# Patient Record
Sex: Female | Born: 1975 | Race: Black or African American | Hispanic: No | Marital: Married | Smoking: Former smoker
Health system: Southern US, Community
[De-identification: ages and names within clinical notes are randomized; demographics above are authoritative.]

## PROBLEM LIST (undated history)

## (undated) ENCOUNTER — Inpatient Hospital Stay (HOSPITAL_COMMUNITY): Payer: Self-pay

## (undated) DIAGNOSIS — C801 Malignant (primary) neoplasm, unspecified: Secondary | ICD-10-CM

## (undated) DIAGNOSIS — Z8679 Personal history of other diseases of the circulatory system: Secondary | ICD-10-CM

## (undated) DIAGNOSIS — Z8742 Personal history of other diseases of the female genital tract: Secondary | ICD-10-CM

## (undated) DIAGNOSIS — Z8632 Personal history of gestational diabetes: Secondary | ICD-10-CM

## (undated) DIAGNOSIS — B009 Herpesviral infection, unspecified: Secondary | ICD-10-CM

## (undated) DIAGNOSIS — Z8719 Personal history of other diseases of the digestive system: Secondary | ICD-10-CM

## (undated) DIAGNOSIS — K219 Gastro-esophageal reflux disease without esophagitis: Secondary | ICD-10-CM

## (undated) DIAGNOSIS — D332 Benign neoplasm of brain, unspecified: Secondary | ICD-10-CM

## (undated) DIAGNOSIS — N920 Excessive and frequent menstruation with regular cycle: Secondary | ICD-10-CM

## (undated) HISTORY — PX: DILATION AND EVACUATION: SHX1459

---

## 1997-12-22 ENCOUNTER — Ambulatory Visit: Admission: RE | Admit: 1997-12-22 | Discharge: 1997-12-22 | Payer: Self-pay | Admitting: *Deleted

## 1998-02-15 ENCOUNTER — Ambulatory Visit (HOSPITAL_COMMUNITY): Admission: RE | Admit: 1998-02-15 | Discharge: 1998-02-15 | Payer: Self-pay | Admitting: *Deleted

## 1998-03-18 ENCOUNTER — Inpatient Hospital Stay (HOSPITAL_COMMUNITY): Admission: AD | Admit: 1998-03-18 | Discharge: 1998-03-18 | Payer: Self-pay | Admitting: *Deleted

## 1998-03-19 ENCOUNTER — Inpatient Hospital Stay (HOSPITAL_COMMUNITY): Admission: AD | Admit: 1998-03-19 | Discharge: 1998-03-23 | Payer: Self-pay | Admitting: *Deleted

## 1998-05-27 ENCOUNTER — Inpatient Hospital Stay (HOSPITAL_COMMUNITY): Admission: AD | Admit: 1998-05-27 | Discharge: 1998-05-27 | Payer: Self-pay | Admitting: *Deleted

## 1998-05-28 ENCOUNTER — Inpatient Hospital Stay (HOSPITAL_COMMUNITY): Admission: AD | Admit: 1998-05-28 | Discharge: 1998-05-30 | Payer: Self-pay | Admitting: *Deleted

## 1998-06-01 ENCOUNTER — Encounter (HOSPITAL_COMMUNITY): Admission: RE | Admit: 1998-06-01 | Discharge: 1998-06-14 | Payer: Self-pay | Admitting: *Deleted

## 1998-07-07 ENCOUNTER — Inpatient Hospital Stay (HOSPITAL_COMMUNITY): Admission: AD | Admit: 1998-07-07 | Discharge: 1998-07-07 | Payer: Self-pay | Admitting: *Deleted

## 1998-09-17 ENCOUNTER — Emergency Department (HOSPITAL_COMMUNITY): Admission: EM | Admit: 1998-09-17 | Discharge: 1998-09-17 | Payer: Self-pay | Admitting: Emergency Medicine

## 1999-02-05 ENCOUNTER — Encounter: Payer: Self-pay | Admitting: Emergency Medicine

## 1999-02-05 ENCOUNTER — Emergency Department (HOSPITAL_COMMUNITY): Admission: EM | Admit: 1999-02-05 | Discharge: 1999-02-05 | Payer: Self-pay | Admitting: Emergency Medicine

## 1999-04-23 ENCOUNTER — Emergency Department (HOSPITAL_COMMUNITY): Admission: EM | Admit: 1999-04-23 | Discharge: 1999-04-23 | Payer: Self-pay | Admitting: Emergency Medicine

## 2005-09-18 DIAGNOSIS — B009 Herpesviral infection, unspecified: Secondary | ICD-10-CM

## 2005-09-18 HISTORY — DX: Herpesviral infection, unspecified: B00.9

## 2009-03-29 ENCOUNTER — Ambulatory Visit (HOSPITAL_COMMUNITY): Admission: RE | Admit: 2009-03-29 | Discharge: 2009-03-29 | Payer: Self-pay | Admitting: Family Medicine

## 2009-04-28 ENCOUNTER — Encounter: Admission: RE | Admit: 2009-04-28 | Discharge: 2009-04-28 | Payer: Self-pay | Admitting: Family Medicine

## 2009-12-17 ENCOUNTER — Emergency Department (HOSPITAL_COMMUNITY): Admission: EM | Admit: 2009-12-17 | Discharge: 2009-12-17 | Payer: Self-pay | Admitting: Family Medicine

## 2010-05-18 ENCOUNTER — Other Ambulatory Visit: Admission: RE | Admit: 2010-05-18 | Discharge: 2010-05-18 | Payer: Self-pay | Admitting: Family Medicine

## 2010-05-27 ENCOUNTER — Encounter: Admission: RE | Admit: 2010-05-27 | Discharge: 2010-05-27 | Payer: Self-pay | Admitting: Family Medicine

## 2010-08-28 ENCOUNTER — Emergency Department (HOSPITAL_COMMUNITY)
Admission: EM | Admit: 2010-08-28 | Discharge: 2010-08-28 | Payer: Self-pay | Source: Home / Self Care | Admitting: Emergency Medicine

## 2010-09-24 ENCOUNTER — Inpatient Hospital Stay (HOSPITAL_COMMUNITY)
Admission: AD | Admit: 2010-09-24 | Discharge: 2010-09-24 | Payer: Self-pay | Source: Home / Self Care | Attending: Obstetrics and Gynecology | Admitting: Obstetrics and Gynecology

## 2010-10-03 LAB — URINE CULTURE
Colony Count: >=100000
Culture  Setup Time: 201201080027

## 2010-10-03 LAB — URINALYSIS, ROUTINE W REFLEX MICROSCOPIC
Bilirubin Urine: NEGATIVE
Ketones, ur: NEGATIVE mg/dL
Nitrite: NEGATIVE
Protein, ur: NEGATIVE mg/dL
Specific Gravity, Urine: 1.015 (ref 1.005–1.030)
Urine Glucose, Fasting: NEGATIVE mg/dL
Urobilinogen, UA: 0.2 mg/dL (ref 0.0–1.0)
pH: 6 (ref 5.0–8.0)

## 2010-10-03 LAB — CBC
HCT: 35 % — ABNORMAL LOW (ref 36.0–46.0)
Hemoglobin: 11.3 g/dL — ABNORMAL LOW (ref 12.0–15.0)
MCH: 25.2 pg — ABNORMAL LOW (ref 26.0–34.0)
MCHC: 32.3 g/dL (ref 30.0–36.0)
MCV: 78.1 fL (ref 78.0–100.0)
Platelets: 246 10*3/uL (ref 150–400)
RBC: 4.48 MIL/uL (ref 3.87–5.11)
RDW: 13.6 % (ref 11.5–15.5)
WBC: 9.3 10*3/uL (ref 4.0–10.5)

## 2010-10-03 LAB — GC/CHLAMYDIA PROBE AMP, GENITAL
Chlamydia, DNA Probe: NEGATIVE
GC Probe Amp, Genital: NEGATIVE

## 2010-10-03 LAB — URINE MICROSCOPIC-ADD ON

## 2010-10-03 LAB — WET PREP, GENITAL
Clue Cells Wet Prep HPF POC: NONE SEEN
Yeast Wet Prep HPF POC: NONE SEEN

## 2010-10-03 LAB — POCT PREGNANCY, URINE: Preg Test, Ur: POSITIVE

## 2010-10-03 LAB — HCG, QUANTITATIVE, PREGNANCY: hCG, Beta Chain, Quant, S: 4346 m[IU]/mL — ABNORMAL HIGH (ref <5)

## 2010-10-08 ENCOUNTER — Encounter: Payer: Self-pay | Admitting: Internal Medicine

## 2010-10-10 ENCOUNTER — Encounter: Payer: Self-pay | Admitting: Family Medicine

## 2010-11-29 LAB — COMPREHENSIVE METABOLIC PANEL
ALT: 13 U/L (ref 0–35)
AST: 18 U/L (ref 0–37)
Albumin: 3.6 g/dL (ref 3.5–5.2)
Alkaline Phosphatase: 105 U/L (ref 39–117)
BUN: 7 mg/dL (ref 6–23)
CO2: 25 mEq/L (ref 19–32)
Calcium: 8.8 mg/dL (ref 8.4–10.5)
Chloride: 107 mEq/L (ref 96–112)
Creatinine, Ser: 0.68 mg/dL (ref 0.4–1.2)
GFR calc Af Amer: >60 mL/min (ref >60)
GFR calc non Af Amer: >60 mL/min (ref >60)
Glucose, Bld: 116 mg/dL — ABNORMAL HIGH (ref 70–99)
Potassium: 3.2 mEq/L — ABNORMAL LOW (ref 3.5–5.1)
Sodium: 138 mEq/L (ref 135–145)
Total Bilirubin: 0.3 mg/dL (ref 0.3–1.2)
Total Protein: 7.2 g/dL (ref 6.0–8.3)

## 2010-11-29 LAB — LIPASE, BLOOD: Lipase: 48 U/L (ref 11–59)

## 2010-11-29 LAB — POCT PREGNANCY, URINE: Preg Test, Ur: NEGATIVE

## 2010-11-29 LAB — POCT CARDIAC MARKERS
CKMB, poc: <1 ng/mL — ABNORMAL LOW (ref 1.0–8.0)
Myoglobin, poc: 76.7 ng/mL (ref 12–200)
Troponin i, poc: <0.05 ng/mL (ref 0.00–0.09)

## 2010-12-07 LAB — POCT URINALYSIS DIP (DEVICE)
Bilirubin Urine: NEGATIVE
Glucose, UA: NEGATIVE mg/dL
Ketones, ur: NEGATIVE mg/dL
Nitrite: NEGATIVE
Protein, ur: NEGATIVE mg/dL
Specific Gravity, Urine: 1.02 (ref 1.005–1.030)
Urobilinogen, UA: 2 mg/dL — ABNORMAL HIGH (ref 0.0–1.0)
pH: 6.5 (ref 5.0–8.0)

## 2010-12-07 LAB — GC/CHLAMYDIA PROBE AMP, GENITAL
Chlamydia, DNA Probe: NEGATIVE
GC Probe Amp, Genital: POSITIVE — AB

## 2010-12-07 LAB — POCT PREGNANCY, URINE: Preg Test, Ur: NEGATIVE

## 2011-04-25 ENCOUNTER — Emergency Department (HOSPITAL_COMMUNITY): Payer: No Typology Code available for payment source

## 2011-04-25 ENCOUNTER — Emergency Department (HOSPITAL_COMMUNITY)
Admission: EM | Admit: 2011-04-25 | Discharge: 2011-04-25 | Disposition: A | Discharge status: Home / Self Care | Payer: No Typology Code available for payment source | Attending: Emergency Medicine | Admitting: Emergency Medicine

## 2011-04-25 DIAGNOSIS — M542 Cervicalgia: Secondary | ICD-10-CM | POA: Insufficient documentation

## 2011-04-25 DIAGNOSIS — S139XXA Sprain of joints and ligaments of unspecified parts of neck, initial encounter: Secondary | ICD-10-CM | POA: Insufficient documentation

## 2011-04-25 LAB — POCT PREGNANCY, URINE: Preg Test, Ur: NEGATIVE

## 2011-08-14 ENCOUNTER — Encounter (HOSPITAL_COMMUNITY): Payer: Self-pay | Admitting: *Deleted

## 2011-08-14 ENCOUNTER — Inpatient Hospital Stay (HOSPITAL_COMMUNITY): Payer: Medicaid Other

## 2011-08-14 ENCOUNTER — Inpatient Hospital Stay (HOSPITAL_COMMUNITY)
Admission: AD | Admit: 2011-08-14 | Discharge: 2011-08-14 | Disposition: A | Discharge status: Home / Self Care | Payer: Medicaid Other | Source: Ambulatory Visit | Attending: Obstetrics & Gynecology | Admitting: Obstetrics & Gynecology

## 2011-08-14 DIAGNOSIS — Z1389 Encounter for screening for other disorder: Secondary | ICD-10-CM

## 2011-08-14 DIAGNOSIS — Z3490 Encounter for supervision of normal pregnancy, unspecified, unspecified trimester: Secondary | ICD-10-CM

## 2011-08-14 DIAGNOSIS — R109 Unspecified abdominal pain: Secondary | ICD-10-CM | POA: Insufficient documentation

## 2011-08-14 DIAGNOSIS — O2390 Unspecified genitourinary tract infection in pregnancy, unspecified trimester: Secondary | ICD-10-CM | POA: Insufficient documentation

## 2011-08-14 DIAGNOSIS — K5900 Constipation, unspecified: Secondary | ICD-10-CM

## 2011-08-14 DIAGNOSIS — N390 Urinary tract infection, site not specified: Secondary | ICD-10-CM

## 2011-08-14 HISTORY — DX: Herpesviral infection, unspecified: B00.9

## 2011-08-14 LAB — DIFFERENTIAL
Basophils Absolute: 0 10*3/uL (ref 0.0–0.1)
Basophils Relative: 0 % (ref 0–1)
Monocytes Relative: 9 % (ref 3–12)
Neutro Abs: 5.6 10*3/uL (ref 1.7–7.7)
Neutrophils Relative %: 60 % (ref 43–77)

## 2011-08-14 LAB — URINE MICROSCOPIC-ADD ON

## 2011-08-14 LAB — WET PREP, GENITAL
Clue Cells Wet Prep HPF POC: NONE SEEN
Trich, Wet Prep: NONE SEEN

## 2011-08-14 LAB — CBC
Hemoglobin: 11.2 g/dL — ABNORMAL LOW (ref 12.0–15.0)
MCHC: 32.2 g/dL (ref 30.0–36.0)
Platelets: 225 10*3/uL (ref 150–400)

## 2011-08-14 LAB — ABO/RH: ABO/RH(D): O POS

## 2011-08-14 LAB — URINALYSIS, ROUTINE W REFLEX MICROSCOPIC
Bilirubin Urine: NEGATIVE
Nitrite: NEGATIVE
Specific Gravity, Urine: 1.015 (ref 1.005–1.030)
Urobilinogen, UA: 0.2 mg/dL (ref 0.0–1.0)

## 2011-08-14 MED ORDER — NITROFURANTOIN MONOHYD MACRO 100 MG PO CAPS: 100.0000 mg | ORAL_CAPSULE | Freq: Two times a day (BID) | ORAL | Status: AC

## 2011-08-14 MED ORDER — ACETAMINOPHEN 325 MG PO TABS
650.0000 mg | ORAL_TABLET | Freq: Once | ORAL | Status: AC
  Administered 2011-08-14: 650 mg via ORAL
  Filled 2011-08-14: qty 2

## 2011-08-14 NOTE — Progress Notes (Signed)
started feeling bad yesterday, cramping.  No bleeding.  Taking tramadol for a toothache- did not help cramping.

## 2011-08-14 NOTE — ED Provider Notes (Signed)
History     CSN: [id number] Arrival date & time: 08/14/2011  9:20 AM   None     No chief complaint on file.   () HPI Kristina Castro is a 35 y.o. female @ [redacted]w[redacted]d gestation who presents to MAU for lower abdominal cramping. Onset of pain yesterday morning. Pain at worst is 8/10 and is currently 8/10. Has not started any prenatal care and was concerned about the amount of pain. Current sex partner x 1 year. LMP 05/25/11. Last pap smear less than one year and was normal at [facility]. Hx of HSV and trichomonas.    Past Medical History  Diagnosis Date  . Abnormal Pap smear   . Heart murmur   . Gestational diabetes   . HSV-2 (herpes simplex virus 2) infection 2007    currently taking meds.  . Anemia   . Gastritis without bleeding 1998    currently on prilosec 40 mg    Past Surgical History  Procedure Date  . Tooth extraction   . Wisdom tooth extraction     Family History  Problem Relation Age of Onset  . Anesthesia problems Neg Hx     History  Substance Use Topics  . Smoking status: Former Smoker -- 0.2 packs/day for .5 years    Types: Cigars    Quit date: 06/14/2011  . Smokeless tobacco: Never Used  . Alcohol Use: No    OB History    Grav Para Term Preterm Abortions TAB SAB Ect Mult Living   4 3 3  0 0 0 0 0 0 3      Review of Systems  Constitutional: Positive for fatigue. Negative for fever, chills and diaphoresis.  HENT: Negative for ear pain, congestion, sore throat, facial swelling, neck pain, neck stiffness, dental problem and sinus pressure.   Eyes: Negative for photophobia, pain and discharge.  Respiratory: Positive for cough. Negative for chest tightness and wheezing.   Cardiovascular:       Heart murmer   Gastrointestinal: Positive for nausea and constipation. Negative for vomiting, abdominal pain, diarrhea and abdominal distention.  Genitourinary: Positive for frequency, vaginal discharge and pelvic pain. Negative for dysuria, urgency, flank pain,  vaginal bleeding and difficulty urinating.  Musculoskeletal: Positive for back pain. Negative for myalgias and gait problem.  Skin: Negative for color change and rash.  Neurological: Positive for light-headedness. Negative for dizziness, speech difficulty, weakness, numbness and headaches.  Psychiatric/Behavioral: Negative for confusion and agitation.    Allergies  Review of patient's allergies indicates no known allergies.  Home Medications  No current outpatient prescriptions on file.  BP 138/58  Pulse 96  Temp(Src) 98.9 F (37.2 C) (Oral)  Resp 20  Ht 5\' 5"  (1.651 m)  Wt 191 lb (86.637 kg)  BMI 31.78 kg/m2  LMP 05/25/2011  Physical Exam  Nursing note and vitals reviewed. Constitutional: She is oriented to person, place, and time. She appears well-developed and well-nourished.  HENT:  Head: Normocephalic and atraumatic.  Eyes: EOM are normal.  Neck: Neck supple.  Cardiovascular: Normal rate.   Pulmonary/Chest: Effort normal.  Abdominal: Soft. There is tenderness in the left lower quadrant. There is no rebound and no guarding.  Genitourinary:       External genitalia without lesions. White vaginal discharge. Cervix closed, long. Uterus consistent with dates. Mildly tender left adnexa, no mass palpable. Rectal exam, small amount of hard stool palpated.  Musculoskeletal: Normal range of motion.  Neurological: She is alert and oriented to person, place,  and time. No cranial nerve deficit.  Skin: Skin is warm and dry.  Psychiatric: She has a normal mood and affect. Her behavior is normal. Judgment and thought content normal.   Results for orders placed during the hospital encounter of 08/14/11 (from the past 24 hour(s))  URINALYSIS, ROUTINE W REFLEX MICROSCOPIC     Status: Abnormal   Collection Time   08/14/11  9:35 AM      Component Value Range   Color, Urine YELLOW  YELLOW    Appearance CLEAR  CLEAR    Specific Gravity, Urine 1.015  1.005 - 1.030    pH 7.5  5.0 - 8.0      Glucose, UA NEGATIVE  NEGATIVE (mg/dL)   Hgb urine dipstick SMALL (*) NEGATIVE    Bilirubin Urine NEGATIVE  NEGATIVE    Ketones, ur NEGATIVE  NEGATIVE (mg/dL)   Protein, ur NEGATIVE  NEGATIVE (mg/dL)   Urobilinogen, UA 0.2  0.0 - 1.0 (mg/dL)   Nitrite NEGATIVE  NEGATIVE    Leukocytes, UA MODERATE (*) NEGATIVE   URINE MICROSCOPIC-ADD ON     Status: Abnormal   Collection Time   08/14/11  9:35 AM      Component Value Range   Squamous Epithelial / LPF MANY (*) RARE    WBC, UA 3-6  <3 (WBC/hpf)   RBC / HPF 3-6  <3 (RBC/hpf)   Bacteria, UA MANY (*) RARE   CBC     Status: Abnormal   Collection Time   08/14/11 10:35 AM      Component Value Range   WBC 9.2  4.0 - 10.5 (K/uL)   RBC 4.40  3.87 - 5.11 (MIL/uL)   Hemoglobin 11.2 (*) 12.0 - 15.0 (g/dL)   HCT 16.1 (*) 09.6 - 46.0 (%)   MCV 79.1  78.0 - 100.0 (fL)   MCH 25.5 (*) 26.0 - 34.0 (pg)   MCHC 32.2  30.0 - 36.0 (g/dL)   RDW 04.5  40.9 - 81.1 (%)   Platelets 225  150 - 400 (K/uL)  DIFFERENTIAL     Status: Normal   Collection Time   08/14/11 10:35 AM      Component Value Range   Neutrophils Relative 60  43 - 77 (%)   Neutro Abs 5.6  1.7 - 7.7 (K/uL)   Lymphocytes Relative 29  12 - 46 (%)   Lymphs Abs 2.7  0.7 - 4.0 (K/uL)   Monocytes Relative 9  3 - 12 (%)   Monocytes Absolute 0.8  0.1 - 1.0 (K/uL)   Eosinophils Relative 2  0 - 5 (%)   Eosinophils Absolute 0.2  0.0 - 0.7 (K/uL)   Basophils Relative 0  0 - 1 (%)   Basophils Absolute 0.0  0.0 - 0.1 (K/uL)  ABO/RH     Status: Normal   Collection Time   08/14/11 10:35 AM      Component Value Range   ABO/RH(D) O POS     Assessment: UTI   Constipation   Viable IUP at 11.4 weeks gestaton  Plan:  Macrobid 100 mg po bid x 7 day   Tylenol for pain   Diet high in fiber   Start prenatal care   Return here as needed. ED Course  Procedures  MDM          Kerrie Buffalo, NP 08/14/11 2009

## 2011-08-15 LAB — GC/CHLAMYDIA PROBE AMP, GENITAL: Chlamydia, DNA Probe: NEGATIVE

## 2011-08-30 NOTE — ED Provider Notes (Signed)
Attestation of Attending Supervision of Advanced Practitioner: Evaluation and management procedures were performed by the PA/NP/CNM/OB Fellow under my supervision/collaboration. Chart reviewed, and agree with management and plan.  Jaynie Collins, M.D. 08/30/2011 11:09 AM

## 2011-11-08 LAB — OB RESULTS CONSOLE RPR: RPR: NONREACTIVE

## 2011-11-08 LAB — OB RESULTS CONSOLE HEPATITIS B SURFACE ANTIGEN: Hepatitis B Surface Ag: NEGATIVE

## 2011-11-08 LAB — OB RESULTS CONSOLE RUBELLA ANTIBODY, IGM: Rubella: IMMUNE

## 2011-11-14 ENCOUNTER — Other Ambulatory Visit: Payer: Self-pay

## 2011-11-16 ENCOUNTER — Other Ambulatory Visit (HOSPITAL_COMMUNITY): Payer: Self-pay | Admitting: Obstetrics and Gynecology

## 2011-11-16 DIAGNOSIS — O2690 Pregnancy related conditions, unspecified, unspecified trimester: Secondary | ICD-10-CM

## 2011-11-16 DIAGNOSIS — Z3689 Encounter for other specified antenatal screening: Secondary | ICD-10-CM

## 2011-11-22 ENCOUNTER — Ambulatory Visit (HOSPITAL_COMMUNITY)
Admission: RE | Admit: 2011-11-22 | Discharge: 2011-11-22 | Disposition: A | Discharge status: Home / Self Care | Payer: BC Managed Care – PPO | Source: Ambulatory Visit | Attending: Obstetrics and Gynecology | Admitting: Obstetrics and Gynecology

## 2011-11-22 ENCOUNTER — Other Ambulatory Visit (HOSPITAL_COMMUNITY): Payer: Self-pay | Admitting: Obstetrics and Gynecology

## 2011-11-22 ENCOUNTER — Other Ambulatory Visit: Payer: Self-pay

## 2011-11-22 DIAGNOSIS — Z3689 Encounter for other specified antenatal screening: Secondary | ICD-10-CM

## 2011-11-22 DIAGNOSIS — O98519 Other viral diseases complicating pregnancy, unspecified trimester: Secondary | ICD-10-CM | POA: Insufficient documentation

## 2011-11-22 DIAGNOSIS — Z1389 Encounter for screening for other disorder: Secondary | ICD-10-CM | POA: Insufficient documentation

## 2011-11-22 DIAGNOSIS — O358XX0 Maternal care for other (suspected) fetal abnormality and damage, not applicable or unspecified: Secondary | ICD-10-CM | POA: Insufficient documentation

## 2011-11-22 DIAGNOSIS — O09299 Supervision of pregnancy with other poor reproductive or obstetric history, unspecified trimester: Secondary | ICD-10-CM | POA: Insufficient documentation

## 2011-11-22 DIAGNOSIS — Z363 Encounter for antenatal screening for malformations: Secondary | ICD-10-CM | POA: Insufficient documentation

## 2011-11-22 DIAGNOSIS — O0930 Supervision of pregnancy with insufficient antenatal care, unspecified trimester: Secondary | ICD-10-CM | POA: Insufficient documentation

## 2011-11-22 DIAGNOSIS — A6000 Herpesviral infection of urogenital system, unspecified: Secondary | ICD-10-CM | POA: Insufficient documentation

## 2011-11-22 DIAGNOSIS — O2690 Pregnancy related conditions, unspecified, unspecified trimester: Secondary | ICD-10-CM

## 2011-11-22 DIAGNOSIS — O26879 Cervical shortening, unspecified trimester: Secondary | ICD-10-CM

## 2011-11-22 NOTE — Progress Notes (Signed)
Genetic Counseling  High-Risk Gestation Note  Appointment Date:  11/22/2011 Referred By: Oliver Pila, MD Date of Birth:  14-Mar-1976    Pregnancy History: [id number] Estimated Date of Delivery: 02/29/12 Estimated Gestational Age: [redacted]w[redacted]d Attending: Particia Nearing, MD   Kristina Castro was seen for genetic counseling because of the previous ultrasound finding of absent nasal bone.   Ultrasound performed at the time of today's visit confirmed the finding of absent fetal nasal bone. Complete ultrasound results reported separately.   An absent or hypoplastic (undeveloped, or slightly smaller than expected) nasal bone is commonly a normal variation with no associated problems.  This may be a family trait and can be more common in some ethnic groups, such as the African American population.  However, an absent or hypoplastic nasal bone has been shown to increase the chance for Down syndrome in a pregnancy.   We reviewed chromosomes, nondisjunction, and the common features and variable prognosis of Down syndrome. We discussed that based on a maternal age of 36 y.o. the chance for Down syndrome in the pregnancy is approximately 1 in 350. The ultrasound finding of absent nasal bone would increase the chance for Down syndrome to approximately 1 in 41.   We reviewed other available screening and diagnostic options including ultrasound and amniocentesis.  We discussed the risks, limitations, and benefits of each. We discussed another type of screening test, noninvasive prenatal testing (NIPT), which utilizes cell free fetal DNA found in the maternal circulation. This test is not diagnostic for chromosome conditions, but can provide information regarding the presence or absence of extra fetal DNA for chromosomes 13, 18 and 21. Thus, it would not identify or rule out all fetal aneuploidy. The reported detection rate is greater than 99% for Trisomy 21, greater than 97% for Trisomy 18, and is approximately  80% (8 out of 10) for Trisomy 13. The false positive rate is thought to be less than 1% for any of these conditions. After thoughtful consideration of these options, Kristina Castro elected to have ultrasound and cell free fetal DNA ([facility]) testing, but declined amniocentesis given the associated risk of complications.  These results will be available in approximately 8-10 days. We will contact the patient and forward results to your office, once available. She understands that ultrasound cannot rule out all birth defects or genetic syndromes.  However, she was counseled that 50-80% of fetuses with Down syndrome, when well visualized, have detectable anomalies or soft markers by ultrasound.   Kristina Castro was provided with written information regarding cystic fibrosis (CF) including the carrier frequency and incidence in the African American population, the availability of carrier testing and prenatal diagnosis if indicated.  In addition, we discussed that CF is routinely screened for as part of the [place] newborn screening panel.  She declined testing today.   Both family histories were reviewed and found to be contributory for bipolar disorder in the father of the pregnancy. Mental health conditions such as bipolar disorder have a tendency to recur within families given that genetic as well as environmental factors are believed to contribute. It might be helpful for her to inform her pediatrician of this family history to ensure that their child(ren) are followed appropriately. Without further information regarding the provided family history, an accurate genetic risk cannot be calculated. Further genetic counseling is warranted if more information is obtained.  Kristina Castro denied exposure to environmental toxins or chemical agents. She denied the use of alcohol, tobacco or street drugs. She  denied significant viral illnesses during the course of her pregnancy. Her medical and surgical histories were contributory for  3 previous TABs.   I counseled Kristina Castro for approximately 30 minutes regarding the above risks and available options.     Quinn Plowman, MS,  Certified Genetic Counselor 11/22/2011

## 2011-12-01 ENCOUNTER — Telehealth (HOSPITAL_COMMUNITY): Payer: Self-pay | Admitting: MS"

## 2011-12-01 NOTE — Telephone Encounter (Signed)
Called Benjie Karvonen to discuss her Harm[facility], cell free fetal DNA testing.  We reviewed that these are within normal limits, showing a less than 1 in 10,000 risk for trisomies 21, 18 and 13.  We reviewed that this testing identifies > 99% of pregnancies with trisomy 21, >97% of pregnancies with trisomy 33, and >80% with trisomy 10; the false positive rate is <0.1% for all conditions.  She understands that this testing does not identify all genetic conditions.  All questions were answered to her satisfaction, she was encouraged to call with additional questions or concerns.  Quinn Plowman, MS Patent attorney

## 2011-12-15 ENCOUNTER — Encounter (HOSPITAL_COMMUNITY): Payer: Self-pay | Admitting: *Deleted

## 2011-12-15 ENCOUNTER — Inpatient Hospital Stay (HOSPITAL_COMMUNITY)
Admission: AD | Admit: 2011-12-15 | Discharge: 2011-12-15 | Disposition: A | Discharge status: Home / Self Care | Payer: Medicaid Other | Source: Ambulatory Visit | Attending: Obstetrics and Gynecology | Admitting: Obstetrics and Gynecology

## 2011-12-15 DIAGNOSIS — N949 Unspecified condition associated with female genital organs and menstrual cycle: Secondary | ICD-10-CM

## 2011-12-15 DIAGNOSIS — O99891 Other specified diseases and conditions complicating pregnancy: Secondary | ICD-10-CM | POA: Insufficient documentation

## 2011-12-15 DIAGNOSIS — R109 Unspecified abdominal pain: Secondary | ICD-10-CM | POA: Insufficient documentation

## 2011-12-15 LAB — URINALYSIS, ROUTINE W REFLEX MICROSCOPIC
Glucose, UA: NEGATIVE mg/dL
Ketones, ur: NEGATIVE mg/dL
Leukocytes, UA: NEGATIVE
Nitrite: NEGATIVE
Protein, ur: NEGATIVE mg/dL
Urobilinogen, UA: 0.2 mg/dL (ref 0.0–1.0)

## 2011-12-15 LAB — URINE MICROSCOPIC-ADD ON

## 2011-12-15 NOTE — MAU Note (Signed)
Lat night at work felt like had sharp pains and pressure in vaginal area, 29 weeks, sees [facility], no vagina bleeding.

## 2011-12-15 NOTE — MAU Provider Note (Signed)
Chief Complaint:  Abdominal Cramping    First Provider Initiated Contact with Patient 12/15/11 1753      Kristina Castro is  36 y.o. [phone].  Patient's last menstrual period was 05/25/2011.. [redacted]w[redacted]d  She presents complaining of Abdominal Cramping . Onset is described as intermittent and has been present 12 hours. Describes sharp pelvic pain, intermittent. Denies abd pain ,abd tightening, blding, LOF, dysuria, or low back pain. Reports 3cm dilated on [place].   Review of imaging reveals last scan on  11/22/11 showed funneling measuring 3-3.6cm with 3cm CL.  Obstetrical/Gynecological History: OB History    Grav Para Term Preterm Abortions TAB SAB Ect Mult Living   4 3 3  0 0 0 0 0 0 3      Past Medical History: Past Medical History  Diagnosis Date  . Abnormal Pap smear   . Heart murmur   . Gestational diabetes   . HSV-2 (herpes simplex virus 2) infection 2007    currently taking meds.  . Anemia   . Gastritis without bleeding 1998    currently on prilosec 40 mg    Past Surgical History: Past Surgical History  Procedure Date  . Tooth extraction   . Wisdom tooth extraction     Family History: Family History  Problem Relation Age of Onset  . Anesthesia problems Neg Hx     Social History: History  Substance Use Topics  . Smoking status: Former Smoker -- 0.2 packs/day for .5 years    Types: Cigars    Quit date: 06/14/2011  . Smokeless tobacco: Never Used  . Alcohol Use: No    Allergies: No Known Allergies  Prescriptions prior to admission  Medication Sig Dispense Refill  . acyclovir (ZOVIRAX) 800 MG tablet Take 800 mg by mouth daily.        Marland Kitchen omeprazole (PRILOSEC) 20 MG capsule Take 20 mg by mouth daily as needed.      . traMADol (ULTRAM) 50 MG tablet Take 50 mg by mouth every 6 (six) hours as needed. Pain Maximum dose= 8 tablets per day         Review of Systems - Negative except what has been reviewed in the HPI  Physical Exam   Blood pressure 133/65, pulse 94,  temperature 97.1 F (36.2 C), temperature source Oral, resp. rate 16, height 5\' 5"  (1.651 m), weight 207 lb 12.8 oz (94.257 kg), last menstrual period 05/25/2011.  General: General appearance - alert, well appearing, and in no distress and oriented to person, place, and time Mental status - alert, oriented to person, place, and time, normal mood, behavior, speech, dress, motor activity, and thought processes, affect appropriate to mood Abdomen - gravid, non tender Focused Gynecological Exam: Cervix: closed/long/thick/post FHR: 140 mod variability, + 15x15 acels, no decels Toco: no contractions  Labs: Recent Results (from the past 24 hour(s))  URINALYSIS, ROUTINE W REFLEX MICROSCOPIC   Collection Time   12/15/11  5:15 PM      Component Value Range   Color, Urine YELLOW  YELLOW    APPearance HAZY (*) CLEAR    Specific Gravity, Urine 1.020  1.005 - 1.030    pH 6.5  5.0 - 8.0    Glucose, UA NEGATIVE  NEGATIVE (mg/dL)   Hgb urine dipstick MODERATE (*) NEGATIVE    Bilirubin Urine NEGATIVE  NEGATIVE    Ketones, ur NEGATIVE  NEGATIVE (mg/dL)   Protein, ur NEGATIVE  NEGATIVE (mg/dL)   Urobilinogen, UA 0.2  0.0 - 1.0 (mg/dL)  Nitrite NEGATIVE  NEGATIVE    Leukocytes, UA NEGATIVE  NEGATIVE   URINE MICROSCOPIC-ADD ON   Collection Time   12/15/11  5:15 PM      Component Value Range   Squamous Epithelial / LPF MANY (*) RARE    WBC, UA 0-2  <3 (WBC/hpf)   RBC / HPF 3-6  <3 (RBC/hpf)   Bacteria, UA FEW (*) RARE    MD Consult: Discussed with Dr. Ambrose Mantle  Assessment: Round Ligament Pain  Plan: Discharge home PTL precautions and comfort measures reviewed  Kristina Castro E. 12/15/2011,6:13 PM

## 2011-12-15 NOTE — Discharge Instructions (Signed)

## 2011-12-15 NOTE — MAU Note (Signed)
Patient was told she is 3 cm already.

## 2012-02-02 LAB — OB RESULTS CONSOLE GBS: GBS: NEGATIVE

## 2012-02-06 ENCOUNTER — Inpatient Hospital Stay (HOSPITAL_COMMUNITY)
Admission: AD | Admit: 2012-02-06 | Discharge: 2012-02-07 | Disposition: A | Discharge status: Home / Self Care | Payer: Medicaid Other | Source: Ambulatory Visit | Attending: Obstetrics and Gynecology | Admitting: Obstetrics and Gynecology

## 2012-02-06 DIAGNOSIS — O4700 False labor before 37 completed weeks of gestation, unspecified trimester: Secondary | ICD-10-CM | POA: Insufficient documentation

## 2012-02-07 ENCOUNTER — Encounter (HOSPITAL_COMMUNITY): Payer: Self-pay | Admitting: *Deleted

## 2012-02-07 MED ORDER — ZOLPIDEM TARTRATE 10 MG PO TABS
10.0000 mg | ORAL_TABLET | Freq: Once | ORAL | Status: AC
  Administered 2012-02-07: 10 mg via ORAL
  Filled 2012-02-07: qty 1

## 2012-02-07 NOTE — Progress Notes (Signed)
FHT from early this am reviewed.  Reactive NST, no significant decels, irreg ctx. 

## 2012-02-07 NOTE — MAU Note (Signed)
SAYS HURT BAD AT 2300.   HAS HX - HSV-  TAKING ACYCLOVIR-  CONFIDENTIAL.  DENIES NRSA.  DENIES  OUTBREAKS.   VE LAST WEEK 1-2 CM.

## 2012-02-07 NOTE — MAU Note (Signed)
Pt Up to walk.

## 2012-02-12 ENCOUNTER — Encounter (HOSPITAL_COMMUNITY): Payer: Self-pay | Admitting: *Deleted

## 2012-02-12 ENCOUNTER — Inpatient Hospital Stay (HOSPITAL_COMMUNITY)
Admission: AD | Admit: 2012-02-12 | Discharge: 2012-02-12 | Disposition: A | Discharge status: Home / Self Care | Payer: Medicaid Other | Source: Ambulatory Visit | Attending: Obstetrics and Gynecology | Admitting: Obstetrics and Gynecology

## 2012-02-12 DIAGNOSIS — O479 False labor, unspecified: Secondary | ICD-10-CM | POA: Insufficient documentation

## 2012-02-12 MED ORDER — ZOLPIDEM TARTRATE 10 MG PO TABS
10.0000 mg | ORAL_TABLET | Freq: Once | ORAL | Status: AC
  Administered 2012-02-12: 10 mg via ORAL
  Filled 2012-02-12: qty 1

## 2012-02-12 MED ORDER — GI COCKTAIL ~~LOC~~
30.0000 mL | Freq: Once | ORAL | Status: AC
  Administered 2012-02-12: 30 mL via ORAL
  Filled 2012-02-12: qty 30

## 2012-02-12 NOTE — MAU Note (Signed)
Pt reports contractions x 3 hours. And nausea "kind of like acid reflux"

## 2012-02-12 NOTE — MAU Note (Signed)
Pt started having contractions @ 2200 and have not worsened.  No bleeding or leaking.

## 2012-02-12 NOTE — Progress Notes (Signed)
Pt complain of epigastric pain and requests some relief.

## 2012-02-13 ENCOUNTER — Encounter (HOSPITAL_COMMUNITY): Payer: Self-pay | Admitting: *Deleted

## 2012-02-13 ENCOUNTER — Telehealth (HOSPITAL_COMMUNITY): Payer: Self-pay | Admitting: *Deleted

## 2012-02-13 NOTE — Telephone Encounter (Signed)
Preadmission screen  

## 2012-02-14 ENCOUNTER — Inpatient Hospital Stay (HOSPITAL_COMMUNITY)
Admission: AD | Admit: 2012-02-14 | Discharge: 2012-02-14 | Disposition: A | Discharge status: Home / Self Care | Payer: Medicaid Other | Source: Ambulatory Visit | Attending: Obstetrics and Gynecology | Admitting: Obstetrics and Gynecology

## 2012-02-14 ENCOUNTER — Encounter (HOSPITAL_COMMUNITY): Payer: Self-pay | Admitting: *Deleted

## 2012-02-14 DIAGNOSIS — O479 False labor, unspecified: Secondary | ICD-10-CM | POA: Insufficient documentation

## 2012-02-14 NOTE — MAU Note (Signed)
Pt states she started having contractions at 2300 02/13/2012

## 2012-02-14 NOTE — Discharge Instructions (Signed)
Pregnancy - Third Trimester The third trimester of pregnancy (the last 3 months) is a period of the most rapid growth for you and your baby. The baby approaches a length of 20 inches and a weight of 6 to 10 pounds. The baby is adding on fat and getting ready for life outside your body. While inside, babies have periods of sleeping and waking, suck their thumbs, and hiccups. You can often feel small contractions of the uterus. This is false labor. It is also called Braxton-Hicks contractions. This is like a practice for labor. The usual problems in this stage of pregnancy include more difficulty breathing, swelling of the hands and feet from water retention, and having to urinate more often because of the uterus and baby pressing on your bladder.  PRENATAL EXAMS  Blood work may continue to be done during prenatal exams. These tests are done to check on your health and the probable health of your baby. Blood work is used to follow your blood levels (hemoglobin). Anemia (low hemoglobin) is common during pregnancy. Iron and vitamins are given to help prevent this. You may also continue to be checked for diabetes. Some of the past blood tests may be done again.   The size of the uterus is measured during each visit. This makes sure your baby is growing properly according to your pregnancy dates.   Your blood pressure is checked every prenatal visit. This is to make sure you are not getting toxemia.   Your urine is checked every prenatal visit for infection, diabetes and protein.   Your weight is checked at each visit. This is done to make sure gains are happening at the suggested rate and that you and your baby are growing normally.   Sometimes, an ultrasound is performed to confirm the position and the proper growth and development of the baby. This is a test done that bounces harmless sound waves off the baby so your caregiver can more accurately determine due dates.   Discuss the type of pain  medication and anesthesia you will have during your labor and delivery.   Discuss the possibility and anesthesia if a Cesarean Section might be necessary.   Inform your caregiver if there is any mental or physical violence at home.  Sometimes, a specialized non-stress test, contraction stress test and biophysical profile are done to make sure the baby is not having a problem. Checking the amniotic fluid surrounding the baby is called an amniocentesis. The amniotic fluid is removed by sticking a needle into the belly (abdomen). This is sometimes done near the end of pregnancy if an early delivery is required. In this case, it is done to help make sure the baby's lungs are mature enough for the baby to live outside of the womb. If the lungs are not mature and it is unsafe to deliver the baby, an injection of cortisone medication is given to the mother 1 to 2 days before the delivery. This helps the baby's lungs mature and makes it safer to deliver the baby. CHANGES OCCURING IN THE THIRD TRIMESTER OF PREGNANCY Your body goes through many changes during pregnancy. They vary from person to person. Talk to your caregiver about changes you notice and are concerned about.  During the last trimester, you have probably had an increase in your appetite. It is normal to have cravings for certain foods. This varies from person to person and pregnancy to pregnancy.   You may begin to get stretch marks on your hips,   abdomen, and breasts. These are normal changes in the body during pregnancy. There are no exercises or medications to take which prevent this change.   Constipation may be treated with a stool softener or adding bulk to your diet. Drinking lots of fluids, fiber in vegetables, fruits, and whole grains are helpful.   Exercising is also helpful. If you have been very active up until your pregnancy, most of these activities can be continued during your pregnancy. If you have been less active, it is helpful  to start an exercise program such as walking. Consult your caregiver before starting exercise programs.   Avoid all smoking, alcohol, un-prescribed drugs, herbs and "street drugs" during your pregnancy. These chemicals affect the formation and growth of the baby. Avoid chemicals throughout the pregnancy to ensure the delivery of a healthy infant.   Backache, varicose veins and hemorrhoids may develop or get worse.   You will tire more easily in the third trimester, which is normal.   The baby's movements may be stronger and more often.   You may become short of breath easily.   Your belly button may stick out.   A yellow discharge may leak from your breasts called colostrum.   You may have a bloody mucus discharge. This usually occurs a few days to a week before labor begins.  HOME CARE INSTRUCTIONS   Keep your caregiver's appointments. Follow your caregiver's instructions regarding medication use, exercise, and diet.   During pregnancy, you are providing food for you and your baby. Continue to eat regular, well-balanced meals. Choose foods such as meat, fish, milk and other low fat dairy products, vegetables, fruits, and whole-grain breads and cereals. Your caregiver will tell you of the ideal weight gain.   A physical sexual relationship may be continued throughout pregnancy if there are no other problems such as early (premature) leaking of amniotic fluid from the membranes, vaginal bleeding, or belly (abdominal) pain.   Exercise regularly if there are no restrictions. Check with your caregiver if you are unsure of the safety of your exercises. Greater weight gain will occur in the last 2 trimesters of pregnancy. Exercising helps:   Control your weight.   Get you in shape for labor and delivery.   You lose weight after you deliver.   Rest a lot with legs elevated, or as needed for leg cramps or low back pain.   Wear a good support or jogging bra for breast tenderness during  pregnancy. This may help if worn during sleep. Pads or tissues may be used in the bra if you are leaking colostrum.   Do not use hot tubs, steam rooms, or saunas.   Wear your seat belt when driving. This protects you and your baby if you are in an accident.   Avoid raw meat, cat litter boxes and soil used by cats. These carry germs that can cause birth defects in the baby.   It is easier to loose urine during pregnancy. Tightening up and strengthening the pelvic muscles will help with this problem. You can practice stopping your urination while you are going to the bathroom. These are the same muscles you need to strengthen. It is also the muscles you would use if you were trying to stop from passing gas. You can practice tightening these muscles up 10 times a set and repeating this about 3 times per day. Once you know what muscles to tighten up, do not perform these exercises during urination. It is more likely   to cause an infection by backing up the urine.   Ask for help if you have financial, counseling or nutritional needs during pregnancy. Your caregiver will be able to offer counseling for these needs as well as refer you for other special needs.   Make a list of emergency phone numbers and have them available.   Plan on getting help from family or friends when you go home from the hospital.   Make a trial run to the hospital.   Take prenatal classes with the father to understand, practice and ask questions about the labor and delivery.   Prepare the baby's room/nursery.   Do not travel out of the city unless it is absolutely necessary and with the advice of your caregiver.   Wear only low or no heal shoes to have better balance and prevent falling.  MEDICATIONS AND DRUG USE IN PREGNANCY  Take prenatal vitamins as directed. The vitamin should contain 1 milligram of folic acid. Keep all vitamins out of reach of children. Only a couple vitamins or tablets containing iron may be fatal  to a baby or young child when ingested.   Avoid use of all medications, including herbs, over-the-counter medications, not prescribed or suggested by your caregiver. Only take over-the-counter or prescription medicines for pain, discomfort, or fever as directed by your caregiver. Do not use aspirin, ibuprofen (Motrin, Advil, Nuprin) or naproxen (Aleve) unless OK'd by your caregiver.   Let your caregiver also know about herbs you may be using.   Alcohol is related to a number of birth defects. This includes fetal alcohol syndrome. All alcohol, in any form, should be avoided completely. Smoking will cause low birth rate and premature babies.   Street/illegal drugs are very harmful to the baby. They are absolutely forbidden. A baby born to an addicted mother will be addicted at birth. The baby will go through the same withdrawal an adult does.  SEEK MEDICAL CARE IF: You have any concerns or worries during your pregnancy. It is better to call with your questions if you feel they cannot wait, rather than worry about them. DECISIONS ABOUT CIRCUMCISION You may or may not know the sex of your baby. If you know your baby is a boy, it may be time to think about circumcision. Circumcision is the removal of the foreskin of the penis. This is the skin that covers the sensitive end of the penis. There is no proven medical need for this. Often this decision is made on what is popular at the time or based upon religious beliefs and social issues. You can discuss these issues with your caregiver or pediatrician. SEEK IMMEDIATE MEDICAL CARE IF:   An unexplained oral temperature above 102 F (38.9 C) develops, or as your caregiver suggests.   You have leaking of fluid from the vagina (birth canal). If leaking membranes are suspected, take your temperature and tell your caregiver of this when you call.   There is vaginal spotting, bleeding or passing clots. Tell your caregiver of the amount and how many pads are  used.   You develop a bad smelling vaginal discharge with a change in the color from clear to white.   You develop vomiting that lasts more than 24 hours.   You develop chills or fever.   You develop shortness of breath.   You develop burning on urination.   You loose more than 2 pounds of weight or gain more than 2 pounds of weight or as suggested by your   caregiver.   You notice sudden swelling of your face, hands, and feet or legs.   You develop belly (abdominal) pain. Round ligament discomfort is a common non-cancerous (benign) cause of abdominal pain in pregnancy. Your caregiver still must evaluate you.   You develop a severe headache that does not go away.   You develop visual problems, blurred or double vision.   If you have not felt your baby move for more than 1 hour. If you think the baby is not moving as much as usual, eat something with sugar in it and lie down on your left side for an hour. The baby should move at least 4 to 5 times per hour. Call right away if your baby moves less than that.   You fall, are in a car accident or any kind of trauma.   There is mental or physical violence at home.          KEEP APPOINTMENT ALREADY SCHEDULED Document Released: 08/29/2001 Document Revised: 08/24/2011 Document Reviewed: 03/03/2009 [employer] Patient Information 2012 [employer]. 

## 2012-02-14 NOTE — MAU Note (Signed)
PT  SAYS   WAS HERE ON Monday.  VE 2.5 CM.    UC  BECAME LESS.   THEN BECAME STRONGER   AT 2300.

## 2012-02-14 NOTE — Progress Notes (Signed)
FHT from 5-27 reviewed.  Reactive NST, occ variable decel, irreg ctx

## 2012-02-18 ENCOUNTER — Encounter (HOSPITAL_COMMUNITY): Payer: Self-pay | Admitting: *Deleted

## 2012-02-18 ENCOUNTER — Inpatient Hospital Stay (HOSPITAL_COMMUNITY)
Admission: AD | Admit: 2012-02-18 | Discharge: 2012-02-20 | DRG: 775 | Disposition: A | Discharge status: Home / Self Care | Payer: Medicaid Other | Source: Ambulatory Visit | Attending: Obstetrics and Gynecology | Admitting: Obstetrics and Gynecology

## 2012-02-18 LAB — CBC
MCHC: 32.6 g/dL (ref 30.0–36.0)
RDW: 14.3 % (ref 11.5–15.5)

## 2012-02-18 MED ORDER — IBUPROFEN 600 MG PO TABS: 600.0000 mg | ORAL_TABLET | Freq: Four times a day (QID) | ORAL | Status: DC | PRN

## 2012-02-18 MED ORDER — TETANUS-DIPHTH-ACELL PERTUSSIS 5-2.5-18.5 LF-MCG/0.5 IM SUSP: 0.5000 mL | Freq: Once | INTRAMUSCULAR | Status: DC

## 2012-02-18 MED ORDER — BENZOCAINE-MENTHOL 20-0.5 % EX AERO: 1.0000 "application " | INHALATION_SPRAY | CUTANEOUS | Status: DC | PRN

## 2012-02-18 MED ORDER — PRENATAL MULTIVITAMIN CH
1.0000 | ORAL_TABLET | Freq: Every day | ORAL | Status: DC
  Administered 2012-02-19 – 2012-02-20 (×2): 1 via ORAL
  Filled 2012-02-18 (×3): qty 1

## 2012-02-18 MED ORDER — DIBUCAINE 1 % RE OINT: 1.0000 "application " | TOPICAL_OINTMENT | RECTAL | Status: DC | PRN

## 2012-02-18 MED ORDER — OXYCODONE-ACETAMINOPHEN 5-325 MG PO TABS: 1.0000 | ORAL_TABLET | ORAL | Status: DC | PRN

## 2012-02-18 MED ORDER — OXYTOCIN BOLUS FROM INFUSION
500.0000 mL | Freq: Once | INTRAVENOUS | Status: DC
  Filled 2012-02-18: qty 500
  Filled 2012-02-18: qty 1000

## 2012-02-18 MED ORDER — PANTOPRAZOLE SODIUM 40 MG PO TBEC
80.0000 mg | DELAYED_RELEASE_TABLET | Freq: Every day | ORAL | Status: DC
  Administered 2012-02-18: 80 mg via ORAL
  Filled 2012-02-18 (×3): qty 2

## 2012-02-18 MED ORDER — BUTORPHANOL TARTRATE 2 MG/ML IJ SOLN
1.0000 mg | Freq: Once | INTRAMUSCULAR | Status: AC
  Administered 2012-02-18: 1 mg via INTRAVENOUS
  Filled 2012-02-18: qty 1

## 2012-02-18 MED ORDER — SIMETHICONE 80 MG PO CHEW
80.0000 mg | CHEWABLE_TABLET | ORAL | Status: DC | PRN
  Administered 2012-02-19: 80 mg via ORAL

## 2012-02-18 MED ORDER — ZOLPIDEM TARTRATE 5 MG PO TABS: 5.0000 mg | ORAL_TABLET | Freq: Every evening | ORAL | Status: DC | PRN

## 2012-02-18 MED ORDER — WITCH HAZEL-GLYCERIN EX PADS: 1.0000 "application " | MEDICATED_PAD | CUTANEOUS | Status: DC | PRN

## 2012-02-18 MED ORDER — IBUPROFEN 600 MG PO TABS
600.0000 mg | ORAL_TABLET | Freq: Four times a day (QID) | ORAL | Status: DC
  Administered 2012-02-18 – 2012-02-20 (×6): 600 mg via ORAL
  Filled 2012-02-18 (×6): qty 1

## 2012-02-18 MED ORDER — SENNOSIDES-DOCUSATE SODIUM 8.6-50 MG PO TABS
2.0000 | ORAL_TABLET | Freq: Every day | ORAL | Status: DC
  Administered 2012-02-18 – 2012-02-19 (×2): 2 via ORAL

## 2012-02-18 MED ORDER — ONDANSETRON HCL 4 MG PO TABS
4.0000 mg | ORAL_TABLET | ORAL | Status: DC | PRN
  Administered 2012-02-19: 4 mg via ORAL
  Filled 2012-02-18: qty 1

## 2012-02-18 MED ORDER — CITRIC ACID-SODIUM CITRATE 334-500 MG/5ML PO SOLN: 30.0000 mL | ORAL | Status: DC | PRN

## 2012-02-18 MED ORDER — OXYCODONE-ACETAMINOPHEN 5-325 MG PO TABS
1.0000 | ORAL_TABLET | ORAL | Status: DC | PRN
  Administered 2012-02-19 – 2012-02-20 (×4): 1 via ORAL
  Filled 2012-02-18 (×3): qty 1
  Filled 2012-02-18: qty 2

## 2012-02-18 MED ORDER — LACTATED RINGERS IV SOLN: INTRAVENOUS | Status: DC

## 2012-02-18 MED ORDER — OXYTOCIN 20 UNITS IN LACTATED RINGERS INFUSION - SIMPLE: 125.0000 mL/h | INTRAVENOUS | Status: AC

## 2012-02-18 MED ORDER — DIPHENHYDRAMINE HCL 25 MG PO CAPS: 25.0000 mg | ORAL_CAPSULE | Freq: Four times a day (QID) | ORAL | Status: DC | PRN

## 2012-02-18 MED ORDER — MEASLES, MUMPS & RUBELLA VAC ~~LOC~~ INJ: 0.5000 mL | INJECTION | Freq: Once | SUBCUTANEOUS | Status: DC

## 2012-02-18 MED ORDER — ONDANSETRON HCL 4 MG/2ML IJ SOLN: 4.0000 mg | Freq: Four times a day (QID) | INTRAMUSCULAR | Status: DC | PRN

## 2012-02-18 MED ORDER — LANOLIN HYDROUS EX OINT: TOPICAL_OINTMENT | CUTANEOUS | Status: DC | PRN

## 2012-02-18 MED ORDER — ONDANSETRON HCL 4 MG/2ML IJ SOLN: 4.0000 mg | INTRAMUSCULAR | Status: DC | PRN

## 2012-02-18 MED ORDER — LIDOCAINE HCL (PF) 1 % IJ SOLN
30.0000 mL | INTRAMUSCULAR | Status: DC | PRN
  Administered 2012-02-18: 30 mL via SUBCUTANEOUS
  Filled 2012-02-18: qty 30

## 2012-02-18 MED ORDER — PANTOPRAZOLE SODIUM 40 MG PO TBEC
40.0000 mg | DELAYED_RELEASE_TABLET | Freq: Every day | ORAL | Status: DC
  Filled 2012-02-18 (×2): qty 1

## 2012-02-18 MED ORDER — OXYTOCIN 20 UNITS IN LACTATED RINGERS INFUSION - SIMPLE: 125.0000 mL/h | Freq: Once | INTRAVENOUS | Status: DC

## 2012-02-18 MED ORDER — PRENATAL MULTIVITAMIN CH
1.0000 | ORAL_TABLET | Freq: Every day | ORAL | Status: DC
  Administered 2012-02-18: 1 via ORAL

## 2012-02-18 MED ORDER — ACETAMINOPHEN 325 MG PO TABS: 650.0000 mg | ORAL_TABLET | ORAL | Status: DC | PRN

## 2012-02-18 MED ORDER — ACYCLOVIR 200 MG PO CAPS
800.0000 mg | ORAL_CAPSULE | Freq: Every day | ORAL | Status: DC
  Filled 2012-02-18 (×2): qty 4

## 2012-02-18 MED ORDER — LACTATED RINGERS IV SOLN: 500.0000 mL | INTRAVENOUS | Status: DC | PRN

## 2012-02-18 MED ORDER — ACYCLOVIR 800 MG PO TABS
800.0000 mg | ORAL_TABLET | Freq: Every day | ORAL | Status: DC
  Administered 2012-02-18 – 2012-02-20 (×3): 800 mg via ORAL
  Filled 2012-02-18 (×3): qty 1

## 2012-02-18 MED ORDER — FLEET ENEMA 7-19 GM/118ML RE ENEM: 1.0000 | ENEMA | RECTAL | Status: DC | PRN

## 2012-02-18 NOTE — Progress Notes (Signed)
Patient ID: Kristina Castro, female   DOB: 1976-05-08, 36 y.o.   MRN: [id number] Delivery note:  The pt arrived on L&D with the cervix 7-8 cm dilated and having very painful contractions. She progressed rapidly to full dilatation and delivered a living female infant ROP over a second degree laceration. The Apgars were 8 at 1 and 9 at 5 minutes. The placenta was delivered intact and the uterus was normal. The laceration was repaired with 3-0 vicryl under local block with 1 % xylocaine. The weight of the infant was 7 pounds 2 ounces.

## 2012-02-18 NOTE — MAU Note (Signed)
Pt reports having ctx off and on all day. Denies leaking or bleeding and reports good fetal movement.

## 2012-02-18 NOTE — H&P (Signed)
NAMEMarland Kitchen  Kristina Castro, Kristina Castro NO.:  [id number]  MEDICAL RECORD NO.:  [id number]  LOCATION:  9121                          FACILITY:  [facility]  PHYSICIAN:  Malachi Pro. Ambrose Mantle, M.D. DATE OF BIRTH:  12-Aug-1976  DATE OF ADMISSION:  02/18/2012 DATE OF DISCHARGE:                             HISTORY & PHYSICAL   PRESENT ILLNESS:  This is a 36 year old black female, para 3-0-3-3, gravida 7.  EDC is February 29, 2012 by an ultrasound at 11 weeks and 4 days on August 14, 2011.  The patient began her prenatal course late.  Her blood group and type is O positive with a negative antibody, rubella immune, RPR nonreactive, urine culture negative.  Hepatitis B surface antigen negative, HIV negative, GC and Chlamydia negative.  Hemoglobin electrophoresis AA.  Cystic fibrosis screening negative.  She was too advanced for first and second trimester screening.  Her 1 hour Glucola was 132. Group B strep was negative.  The patient began her prenatal course at our office on November 08, 2011 at 23 and 6/7th weeks gestation.  After that time, she basically had no significant complications.  She does take Valtrex for suppression of herpes.  She began having contractions today and presented herself to the maternity admission unit 4-5 cm dilated, 100% effaced at a -1 station.  She then wanted to go without any analgesics except IV but there was not time to give her the IV analgesia.  PAST MEDICAL HISTORY:  Allergies:  No known drug allergies.  No latex allergy.  MEDICAL HISTORY:  She has had 3 therapeutic abortions.  She did have gestational diabetes with her second child.  She has history of herpes simplex virus.  She had early abortions x3 as her only surgeries.  MEDICATIONS:  Acyclovir and Prilosec.  FAMILY HISTORY:  Mother with breast cancer, diabetes and her father has high blood pressure.  OBSTETRIC HISTORY:  In 1999, 2003, and 2006, the patient delivered vaginally a 7 pound 2 ounce and 6  pounds 14 ounce females and a 6 pound 15 ounce female.  In 2007, 2009, and 2012, she had terminations of pregnancy.  She is active but does not exercise formally. She denies alcohol, tobacco, and illicit substance abuse.  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  PHYSICAL EXAMINATION:  VITAL SIGNS:  On admission, temperature was 97.4, pulse was 120, respirations 22, blood pressure 134/75. HEART:  Normal size and sounds.  No murmurs. LUNGS:  Clear to auscultation. ABDOMEN:  Soft. PELVIC:  The fundal height at her last prenatal visit was 37 cm on Feb 08, 2012.  At that time, she was 2 cm and 50%.  When I first examined the patient here in the hospital, her cervix was 7-8 cm, 100% vertex at a 0 station.  ADMITTING IMPRESSION:  Intrauterine pregnancy at 38 weeks and 3 days, active labor, history of herpes on Valtrex suppression.  The patient will be prepared for delivery.     Malachi Pro. Ambrose Mantle, M.D.     TFH/MEDQ  D:  02/18/2012  T:  02/18/2012  Job:  [id number]

## 2012-02-19 LAB — CBC
HCT: 29.4 % — ABNORMAL LOW (ref 36.0–46.0)
MCHC: 31.6 g/dL (ref 30.0–36.0)
MCV: 76.4 fL — ABNORMAL LOW (ref 78.0–100.0)
Platelets: 212 10*3/uL (ref 150–400)
RDW: 14.5 % (ref 11.5–15.5)

## 2012-02-19 LAB — RPR: RPR Ser Ql: NONREACTIVE

## 2012-02-19 NOTE — Progress Notes (Signed)
UR chart review completed.  

## 2012-02-19 NOTE — Progress Notes (Signed)
Patient ID: Kristina Castro, female   DOB: [birth year], 36 y.o.   MRN: [id number] #1 afebrile BP normal.

## 2012-02-20 MED ORDER — IBUPROFEN 600 MG PO TABS: 600.0000 mg | ORAL_TABLET | Freq: Four times a day (QID) | ORAL | Status: AC

## 2012-02-20 MED ORDER — OXYCODONE-ACETAMINOPHEN 5-325 MG PO TABS: 1.0000 | ORAL_TABLET | ORAL | Status: AC | PRN

## 2012-02-20 NOTE — Discharge Instructions (Signed)
booklet °

## 2012-02-20 NOTE — Progress Notes (Signed)
Patient ID: Kristina Castro, female   DOB: [birth year], 36 y.o.   MRN: [id number] #2 afebrile BP normal for d/c

## 2012-02-20 NOTE — Discharge Summary (Signed)
NAMEMarland Kitchen  MARCENE, LASKOWSKI NO.:  [id number]  MEDICAL RECORD NO.:  [id number]  LOCATION:  9121                          [facility]ILITY:  [facility]  PHYSICIAN:  Malachi Pro. Ambrose Mantle, M.D. DATE OF BIRTH:  [birth year]  DATE OF ADMISSION:  02/18/2012 DATE OF DISCHARGE:  02/20/2012                              DISCHARGE SUMMARY   This is a 36 year old black female, para 3-0-3-3 gravida 7, EDC February 29, 2012, and ultrasound at 11 weeks and 4 days on August 14, 2011.  The patient began late prenatal care.  Blood group and type O positive, negative antibody, rubella immune, RPR nonreactive, urine culture negative.  Hepatitis B surface antigen negative, HIV negative, GC and Chlamydia negative.  Hemoglobin, electrophoresis AA.  Cystic fibrosis screen negative.  She was too advanced for 1st and 2nd trimester screening, 1-hour Glucola 132.  Group B strep negative.  The patient's prenatal course was uncomplicated.  She did take Valtrex for suppression of herpes.  She began having contractions on the day of admission, came to the maternity admission unit, 4-5 cm dilated.  She was admitted and I came to the hospital immediately.  When I examined her, she was 7-8 cm, 100%.  She progressed rapidly to full dilatation and delivered a living female infant ROP over second-degree laceration.  Apgars were 8 at one and 9 at five minutes.  The placenta was delivered intact.  Uterus was normal.  Laceration repaired with 3-0 Vicryl under local block with 1% Xylocaine.  Infant weight was 7 pounds and 2 ounces.  Postpartum, the patient did very well and was discharged on the 2nd postpartum day. Initial hemoglobin was 11.9, hematocrit 36.5, white count 12,300, platelet count 238,000.  Followup hemoglobin 9.3.  RPR nonreactive.  FINAL DIAGNOSES:  Intrauterine pregnancy, 38 weeks and 3 days, delivered vertex, ROP.  OPERATION:  Spontaneous delivery, ROP.  Repair of second-degree midline laceration.  FINAL  CONDITION:  Improved.  INSTRUCTIONS:  Include our regular discharge instruction booklet.  The patient is given prescriptions for Motrin 600 mg, 15 tablets 1 every 6 hours as needed for pain and Percocet 5/325, 15 tablets, 1 every 6 hours as needed for pain.  She is advised to return to the office in 6 weeks for followup examination, take iron pills, ferrous sulfate 325 mg daily for 6 weeks.     Malachi Pro. Ambrose Mantle, M.D.     TFH/MEDQ  D:  02/20/2012  T:  02/20/2012  Job:  [id number]

## 2012-02-23 ENCOUNTER — Inpatient Hospital Stay (HOSPITAL_COMMUNITY): Admission: RE | Admit: 2012-02-23 | Payer: BC Managed Care – PPO | Source: Ambulatory Visit

## 2013-05-11 ENCOUNTER — Inpatient Hospital Stay (HOSPITAL_COMMUNITY)
Admission: EM | Admit: 2013-05-11 | Discharge: 2013-05-13 | DRG: 183 | Disposition: A | Discharge status: Home / Self Care | Payer: BC Managed Care – PPO | Attending: Internal Medicine | Admitting: Internal Medicine

## 2013-05-11 ENCOUNTER — Emergency Department (HOSPITAL_COMMUNITY): Payer: BC Managed Care – PPO

## 2013-05-11 ENCOUNTER — Encounter (HOSPITAL_COMMUNITY): Payer: Self-pay | Admitting: *Deleted

## 2013-05-11 DIAGNOSIS — Z87891 Personal history of nicotine dependence: Secondary | ICD-10-CM

## 2013-05-11 DIAGNOSIS — K219 Gastro-esophageal reflux disease without esophagitis: Secondary | ICD-10-CM | POA: Diagnosis present

## 2013-05-11 DIAGNOSIS — R011 Cardiac murmur, unspecified: Secondary | ICD-10-CM | POA: Diagnosis present

## 2013-05-11 DIAGNOSIS — K2970 Gastritis, unspecified, without bleeding: Secondary | ICD-10-CM | POA: Diagnosis present

## 2013-05-11 DIAGNOSIS — B009 Herpesviral infection, unspecified: Secondary | ICD-10-CM | POA: Diagnosis present

## 2013-05-11 DIAGNOSIS — K56609 Unspecified intestinal obstruction, unspecified as to partial versus complete obstruction: Secondary | ICD-10-CM

## 2013-05-11 DIAGNOSIS — K5900 Constipation, unspecified: Principal | ICD-10-CM | POA: Diagnosis present

## 2013-05-11 DIAGNOSIS — R109 Unspecified abdominal pain: Secondary | ICD-10-CM | POA: Diagnosis present

## 2013-05-11 DIAGNOSIS — E876 Hypokalemia: Secondary | ICD-10-CM

## 2013-05-11 LAB — CBC WITH DIFFERENTIAL/PLATELET
Basophils Relative: 0 % (ref 0–1)
Eosinophils Absolute: 0.2 10*3/uL (ref 0.0–0.7)
MCH: 27.3 pg (ref 26.0–34.0)
MCHC: 34.2 g/dL (ref 30.0–36.0)
Neutrophils Relative %: 55 % (ref 43–77)
Platelets: 258 10*3/uL (ref 150–400)
RBC: 5.09 MIL/uL (ref 3.87–5.11)

## 2013-05-11 LAB — URINALYSIS, ROUTINE W REFLEX MICROSCOPIC
Bilirubin Urine: NEGATIVE
Glucose, UA: NEGATIVE mg/dL
Hgb urine dipstick: NEGATIVE
Nitrite: NEGATIVE
Specific Gravity, Urine: 1.008 (ref 1.005–1.030)
pH: 6.5 (ref 5.0–8.0)

## 2013-05-11 LAB — COMPREHENSIVE METABOLIC PANEL
ALT: 12 U/L (ref 0–35)
AST: 13 U/L (ref 0–37)
Albumin: 3.7 g/dL (ref 3.5–5.2)
Alkaline Phosphatase: 136 U/L — ABNORMAL HIGH (ref 39–117)
Potassium: 3.6 mEq/L (ref 3.5–5.1)
Sodium: 140 mEq/L (ref 135–145)
Total Protein: 7.6 g/dL (ref 6.0–8.3)

## 2013-05-11 LAB — URINE MICROSCOPIC-ADD ON

## 2013-05-11 LAB — POCT I-STAT TROPONIN I: Troponin i, poc: 0 ng/mL (ref 0.00–0.08)

## 2013-05-11 MED ORDER — IOHEXOL 300 MG/ML  SOLN
25.0000 mL | INTRAMUSCULAR | Status: DC
  Administered 2013-05-11: 25 mL via ORAL

## 2013-05-11 MED ORDER — SODIUM CHLORIDE 0.9 % IV SOLN
INTRAVENOUS | Status: DC
  Administered 2013-05-12 (×2): via INTRAVENOUS

## 2013-05-11 MED ORDER — ONDANSETRON HCL 4 MG/2ML IJ SOLN
4.0000 mg | Freq: Four times a day (QID) | INTRAMUSCULAR | Status: DC | PRN
  Administered 2013-05-12: 4 mg via INTRAVENOUS
  Filled 2013-05-11: qty 2

## 2013-05-11 MED ORDER — ONDANSETRON HCL 4 MG/2ML IJ SOLN
INTRAMUSCULAR | Status: AC
  Administered 2013-05-11: 23:00:00
  Filled 2013-05-11: qty 2

## 2013-05-11 MED ORDER — MORPHINE SULFATE 4 MG/ML IJ SOLN
4.0000 mg | Freq: Once | INTRAMUSCULAR | Status: AC
  Administered 2013-05-11: 4 mg via INTRAVENOUS
  Filled 2013-05-11: qty 1

## 2013-05-11 MED ORDER — IOHEXOL 300 MG/ML  SOLN
100.0000 mL | Freq: Once | INTRAMUSCULAR | Status: AC | PRN
  Administered 2013-05-11: 100 mL via INTRAVENOUS

## 2013-05-11 MED ORDER — ACETAMINOPHEN 650 MG RE SUPP: 650.0000 mg | Freq: Four times a day (QID) | RECTAL | Status: DC | PRN

## 2013-05-11 MED ORDER — ACYCLOVIR 800 MG PO TABS
800.0000 mg | ORAL_TABLET | Freq: Every day | ORAL | Status: DC
  Administered 2013-05-12 – 2013-05-13 (×2): 800 mg via ORAL
  Filled 2013-05-11 (×3): qty 1

## 2013-05-11 MED ORDER — ACETAMINOPHEN 325 MG PO TABS
650.0000 mg | ORAL_TABLET | Freq: Four times a day (QID) | ORAL | Status: DC | PRN
  Administered 2013-05-12 – 2013-05-13 (×3): 650 mg via ORAL
  Filled 2013-05-11 (×3): qty 2

## 2013-05-11 MED ORDER — PANTOPRAZOLE SODIUM 40 MG IV SOLR
40.0000 mg | Freq: Every day | INTRAVENOUS | Status: DC
  Administered 2013-05-11 – 2013-05-12 (×2): 40 mg via INTRAVENOUS
  Filled 2013-05-11 (×3): qty 40

## 2013-05-11 MED ORDER — MORPHINE SULFATE 2 MG/ML IJ SOLN
1.0000 mg | INTRAMUSCULAR | Status: DC | PRN
  Administered 2013-05-12: 1 mg via INTRAVENOUS
  Filled 2013-05-11: qty 1

## 2013-05-11 MED ORDER — ONDANSETRON HCL 4 MG PO TABS: 4.0000 mg | ORAL_TABLET | Freq: Four times a day (QID) | ORAL | Status: DC | PRN

## 2013-05-11 NOTE — ED Notes (Signed)
Rob Browning, PA at bedside  

## 2013-05-11 NOTE — ED Notes (Signed)
Pt is here with lower back pain, epigastric pain, and nausea since 2pm today.  Pt denies vomiting or diarrhea.  Pt states LMP couple months ago and she has implant.  Pt has history of gastritis

## 2013-05-11 NOTE — ED Provider Notes (Signed)
CSN: [id number]     Arrival date & time 05/11/13  1641 History     First MD Initiated Contact with Patient 05/11/13 1801     Chief Complaint  Patient presents with  . Abdominal Pain  . Nausea  . Back Pain   (Consider location/radiation/quality/duration/timing/severity/associated sxs/prior Treatment) HPI Comments: Patient presents emergency department with chief complaint of abdominal pain and back pain that started today at around 2 PM. She states that her symptoms have progressively worsened. She denies vomiting or diarrhea, but states that she has felt very nauseated. She denies any dysuria or vaginal discharge. She states that she has a history of gastritis. She is tried taking Prilosec with no relief. Nothing makes her symptoms better or worse.  The history is provided by the patient. No language interpreter was used.    Past Medical History  Diagnosis Date  . Abnormal Pap smear   . Heart murmur   . HSV-2 (herpes simplex virus 2) infection 2007    currently taking meds.  . Anemia   . Gastritis without bleeding 1998    currently on prilosec 40 mg  . Gestational diabetes     not this pregnancy   Past Surgical History  Procedure Laterality Date  . Tooth extraction    . Wisdom tooth extraction    . No past surgeries    . Dilation and curettage of uterus     Family History  Problem Relation Age of Onset  . Anesthesia problems Neg Hx   . Cancer Mother     breast  . Diabetes Mother   . Hypertension Father   . Hypertension Paternal Aunt   . Stroke Maternal Grandmother   . Diabetes Maternal Grandmother    History  Substance Use Topics  . Smoking status: Former Smoker -- 0.25 packs/day for .5 years    Types: Cigars    Quit date: 06/14/2011  . Smokeless tobacco: Never Used  . Alcohol Use: No   OB History   Grav Para Term Preterm Abortions TAB SAB Ect Mult Living   7 4 4  0 3 3 0 0 0 4     Review of Systems  All other systems reviewed and are  negative.    Allergies  Review of patient's allergies indicates no known allergies.  Home Medications   Current Outpatient Rx  Name  Route  Sig  Dispense  Refill  . acyclovir (ZOVIRAX) 800 MG tablet   Oral   Take 800 mg by mouth daily.           Marland Kitchen omeprazole (PRILOSEC) 20 MG capsule   Oral   Take 40 mg by mouth daily as needed. Used for gastritis.         . Prenatal Vit-Fe Fumarate-FA (PRENATAL MULTIVITAMIN) TABS   Oral   Take 1 tablet by mouth daily.          BP 132/79  Pulse 90  Temp(Src) 98 F (36.7 C) (Oral)  Resp 22  SpO2 97% Physical Exam  Nursing note and vitals reviewed. Constitutional: She is oriented to person, place, and time. She appears well-developed and well-nourished.  HENT:  Head: Normocephalic and atraumatic.  Eyes: Conjunctivae and EOM are normal. Pupils are equal, round, and reactive to light.  Neck: Normal range of motion. Neck supple.  Cardiovascular: Normal rate and regular rhythm.  Exam reveals no gallop and no friction rub.   No murmur heard. Pulmonary/Chest: Effort normal and breath sounds normal. No respiratory distress. She has  no wheezes. She has no rales. She exhibits no tenderness.  Abdominal: Soft. Bowel sounds are normal. She exhibits no distension and no mass. There is tenderness. There is no rebound and no guarding.  Diffuse tenderness, but with some localization to the left lower quadrant, no fluid wave, or signs of peritonitis  Musculoskeletal: Normal range of motion. She exhibits no edema and no tenderness.  Neurological: She is alert and oriented to person, place, and time.  Skin: Skin is warm and dry.  Psychiatric: She has a normal mood and affect. Her behavior is normal. Judgment and thought content normal.    ED Course   Procedures (including critical care time)  Labs Reviewed  COMPREHENSIVE METABOLIC PANEL - Abnormal; Notable for the following:    Alkaline Phosphatase 136 (*)    All other components within normal  limits  URINALYSIS, ROUTINE W REFLEX MICROSCOPIC - Abnormal; Notable for the following:    APPearance CLOUDY (*)    Leukocytes, UA TRACE (*)    All other components within normal limits  URINE MICROSCOPIC-ADD ON - Abnormal; Notable for the following:    Squamous Epithelial / LPF MANY (*)    All other components within normal limits  CBC WITH DIFFERENTIAL  LIPASE, BLOOD  POCT PREGNANCY, URINE  POCT I-STAT TROPONIN I   Results for orders placed during the hospital encounter of 05/11/13  CBC WITH DIFFERENTIAL      Result Value Range   WBC 9.0  4.0 - 10.5 K/uL   RBC 5.09  3.87 - 5.11 MIL/uL   Hemoglobin 13.9  12.0 - 15.0 g/dL   HCT 16.1  09.6 - 04.5 %   MCV 80.0  78.0 - 100.0 fL   MCH 27.3  26.0 - 34.0 pg   MCHC 34.2  30.0 - 36.0 g/dL   RDW 40.9  81.1 - 91.4 %   Platelets 258  150 - 400 K/uL   Neutrophils Relative % 55  43 - 77 %   Neutro Abs 5.0  1.7 - 7.7 K/uL   Lymphocytes Relative 37  12 - 46 %   Lymphs Abs 3.3  0.7 - 4.0 K/uL   Monocytes Relative 5  3 - 12 %   Monocytes Absolute 0.5  0.1 - 1.0 K/uL   Eosinophils Relative 2  0 - 5 %   Eosinophils Absolute 0.2  0.0 - 0.7 K/uL   Basophils Relative 0  0 - 1 %   Basophils Absolute 0.0  0.0 - 0.1 K/uL  COMPREHENSIVE METABOLIC PANEL      Result Value Range   Sodium 140  135 - 145 mEq/L   Potassium 3.6  3.5 - 5.1 mEq/L   Chloride 103  96 - 112 mEq/L   CO2 26  19 - 32 mEq/L   Glucose, Bld 96  70 - 99 mg/dL   BUN 6  6 - 23 mg/dL   Creatinine, Ser 7.82  0.50 - 1.10 mg/dL   Calcium 9.3  8.4 - 95.6 mg/dL   Total Protein 7.6  6.0 - 8.3 g/dL   Albumin 3.7  3.5 - 5.2 g/dL   AST 13  0 - 37 U/L   ALT 12  0 - 35 U/L   Alkaline Phosphatase 136 (*) 39 - 117 U/L   Total Bilirubin 0.3  0.3 - 1.2 mg/dL   GFR calc non Af Amer >90  >90 mL/min   GFR calc Af Amer >90  >90 mL/min  LIPASE, BLOOD  Result Value Range   Lipase 56  11 - 59 U/L  URINALYSIS, ROUTINE W REFLEX MICROSCOPIC      Result Value Range   Color, Urine YELLOW  YELLOW    APPearance CLOUDY (*) CLEAR   Specific Gravity, Urine 1.008  1.005 - 1.030   pH 6.5  5.0 - 8.0   Glucose, UA NEGATIVE  NEGATIVE mg/dL   Hgb urine dipstick NEGATIVE  NEGATIVE   Bilirubin Urine NEGATIVE  NEGATIVE   Ketones, ur NEGATIVE  NEGATIVE mg/dL   Protein, ur NEGATIVE  NEGATIVE mg/dL   Urobilinogen, UA 0.2  0.0 - 1.0 mg/dL   Nitrite NEGATIVE  NEGATIVE   Leukocytes, UA TRACE (*) NEGATIVE  URINE MICROSCOPIC-ADD ON      Result Value Range   Squamous Epithelial / LPF MANY (*) RARE   WBC, UA 0-2  <3 WBC/hpf   Urine-Other AMORPHOUS URATES/PHOSPHATES    POCT PREGNANCY, URINE      Result Value Range   Preg Test, Ur NEGATIVE  NEGATIVE  POCT I-STAT TROPONIN I      Result Value Range   Troponin i, poc 0.00  0.00 - 0.08 ng/mL   Comment 3            Ct Abdomen Pelvis W Contrast  05/11/2013   *RADIOLOGY REPORT*  Clinical Data: Mid abdominal pain, left lower abdominal quadrant pain, back pain, nausea  CT ABDOMEN AND PELVIS WITH CONTRAST  Technique:  Multidetector CT imaging of the abdomen and pelvis was performed following the standard protocol during bolus administration of intravenous contrast.  Contrast: OMNIPAQUE IOHEXOL 300 MG/ML  SOLN  Comparison: None.  Findings:  Normal hepatic contour. An approximately 7 cm hypoattenuating lesion within the left lobe of the liver (image 28, series 2) is too small to accurately characterize but may represent hepatic cysts.  The gallbladder is decompressed and otherwise normal.  No intra or extrahepatic biliary ductal dilatation.  No ascites.  There is symmetric enhancement of the bilateral kidneys.  No discrete renal lesion.  No definite renal stones on the post contrast examination.  No urinary obstruction or perinephric stranding.   A partially thrombosed right sided gonadal vein is incidentally noted (images 53 and 60, series 2). No definite ureteral stones.  Normal appearance of the bilateral adrenal glands, pancreas and spleen.  Ingested enteric  contrast is seen primarily within the stomach and proximal small bowel.  There is mild dilatation of several loops of the mid small bowel with apparent transition point within the midline of the lower abdomen (axial image 47, series 2, coronal image 50).  This finding is associated with decompression of the downstream of small bowel and is worrisome for a least partial small bowel obstruction.  Minimal colonic stool burden.  Colonic diverticulosis without evidence of diverticulitis.  Normal appearance of the appendix.  No pneumoperitoneum, pneumatosis or portal venous gas.  Normal caliber of the abdominal aorta.  The major branch vessels of the abdominal aorta are patent on this non CTA examination.  No definite retroperitoneal, mesenteric, pelvic or inguinal lymphadenopathy.  Normal appearance of the uterus.  There is a possible 2.0 x 1.5 cm left-sided hypoattenuating ( approximately 20 HU) presumably physiologic adnexal cyst (image 76, series 2).  No discrete right- sided adnexal lesion.  No free fluid in the pelvis.  Limited visualization of the lower thorax demonstrates minimal bibasilar atelectasis.  No focal airspace opacities or pleural effusion.  Normal heart size.  No pericardial effusion.  No acute  or aggressive osseous abnormalities.  IMPRESSION:  1.  Findings worrisome for possible developing at least partial small bowel obstruction with apparent transition point within the midline of the lower abdomen.  As an etiology of this obstruction is not depicted, this is presumably secondary to adhesions.  2.  Possible 2.0 cm presumed physiologic left adnexal cyst.  This is almost certainly benign, and no specific imaging follow up is recommended according to the Society of Radiologists in Ultrasound 2010 Consensus  Conference Statement (D Lenis Noon et al. Management of Asymptomatic Ovarian and Other Adnexal Cysts Imaged at [place]:  Society of Radiologists in Ultrasound Consensus Conference Statement 2010. Radiology  256 (Sept 2010): 943-954.).   Original Report Authenticated By: Tacey Ruiz, MD     1. SBO (small bowel obstruction)     MDM  Patient with abdominal pain and low back pain. This patient is having pain in the lower left quadrant, which seems to be more focal in the rest order CT scan. Treat patient's pain with morphine.  CT scan is remarkable for developing SBO. Discussed with Dr. Micheline Maze, who recommends admission. Will admit the patient.  Roxy Horseman, PA-C 05/11/13 2122

## 2013-05-11 NOTE — ED Notes (Signed)
MD at bedside. 

## 2013-05-11 NOTE — ED Notes (Signed)
CT notified pt finished with contrast. 

## 2013-05-11 NOTE — H&P (Signed)
[facility] History and Physical  Kristina Castro JYN:[id number] DOB: Feb 12, 1976 DOA: 05/11/2013  Referring physician: ER physician. PCP: Pcp Not In System   Chief Complaint: Abdominal pain.  HPI: Kristina Castro is a 37 y.o. female who has chronic epigastric discomfort and is being treated for GERD presents with complaints of increasing abdominal pain from usual. The pain is at this time in the lower quadrants and radiating to the back. Patient has had some nausea but denies any vomiting. The symptoms started off in the morning. Denies any diarrhea and has had bowel movement yesterday. In the ER CT abdomen and pelvis shows features concerning for early small bowel obstruction. Patient has been admitted for further management. Patient states she has not had any surgeries in the abdomen. Patient denies any chest pain or short of breath.  Review of Systems: As presented in the history of presenting illness, rest negative.  Past Medical History  Diagnosis Date  . Abnormal Pap smear   . Heart murmur   . HSV-2 (herpes simplex virus 2) infection 2007    currently taking meds.  . Anemia   . Gastritis without bleeding 1998    currently on prilosec 40 mg  . Gestational diabetes     not this pregnancy   Past Surgical History  Procedure Laterality Date  . Tooth extraction    . Wisdom tooth extraction    . No past surgeries    . Dilation and curettage of uterus     Social History:  reports that she quit smoking about 22 months ago. Her smoking use included Cigars. She has never used smokeless tobacco. She reports that she does not drink alcohol or use illicit drugs. Home. where does patient live-- Can do ADLs. Can patient participate in ADLs?  No Known Allergies  Family History  Problem Relation Age of Onset  . Anesthesia problems Neg Hx   . Cancer Mother     breast  . Diabetes Mother   . Hypertension Father   . Hypertension Paternal Aunt   . Stroke Maternal Grandmother   .  Diabetes Maternal Grandmother       Prior to Admission medications   Medication Sig Start Date End Date Taking? Authorizing Provider  acetaminophen (TYLENOL) 500 MG tablet Take 1,000 mg by mouth every 6 (six) hours as needed for pain.   Yes Historical Provider, MD  acyclovir (ZOVIRAX) 800 MG tablet Take 800 mg by mouth daily.     Yes Historical Provider, MD  omeprazole (PRILOSEC) 20 MG capsule Take 40 mg by mouth daily as needed. Used for gastritis.   Yes Historical Provider, MD   Physical Exam: Filed Vitals:   05/11/13 1930 05/11/13 2100 05/11/13 2130 05/11/13 2200  BP: 124/69 124/74 123/90 135/80  Pulse: 69 88 78 82  Temp:      TempSrc:      Resp:  18 20   SpO2: 98% 99% 100% 97%     General:  Well-developed and nourished.  Eyes: Anicteric no pallor.  ENT: No discharge from ears eyes nose mouth.  Neck: No mass felt.  Cardiovascular: S1-S2 heard.  Respiratory: No rhonchi or crepitations.  Abdomen: Soft nontender bowel sounds hypoactive.  Skin: No rash.  Musculoskeletal: No edema.  Psychiatric: Appears normal.  Neurologic: Alert awake oriented to time place and person. Moves all extremities.  Labs on Admission:  Basic Metabolic Panel:  Recent Labs Lab 05/11/13 1703  NA 140  K 3.6  CL 103  CO2 26  GLUCOSE 96  BUN 6  CREATININE 0.68  CALCIUM 9.3   Liver Function Tests:  Recent Labs Lab 05/11/13 1703  AST 13  ALT 12  ALKPHOS 136*  BILITOT 0.3  PROT 7.6  ALBUMIN 3.7    Recent Labs Lab 05/11/13 1703  LIPASE 56   No results found for this basename: AMMONIA,  in the last 168 hours CBC:  Recent Labs Lab 05/11/13 1703  WBC 9.0  NEUTROABS 5.0  HGB 13.9  HCT 40.7  MCV 80.0  PLT 258   Cardiac Enzymes: No results found for this basename: CKTOTAL, CKMB, CKMBINDEX, TROPONINI,  in the last 168 hours  BNP (last 3 results) No results found for this basename: PROBNP,  in the last 8760 hours CBG: No results found for this basename: GLUCAP,  in  the last 168 hours  Radiological Exams on Admission: Ct Abdomen Pelvis W Contrast  05/11/2013   *RADIOLOGY REPORT*  Clinical Data: Mid abdominal pain, left lower abdominal quadrant pain, back pain, nausea  CT ABDOMEN AND PELVIS WITH CONTRAST  Technique:  Multidetector CT imaging of the abdomen and pelvis was performed following the standard protocol during bolus administration of intravenous contrast.  Contrast: OMNIPAQUE IOHEXOL 300 MG/ML  SOLN  Comparison: None.  Findings:  Normal hepatic contour. An approximately 7 cm hypoattenuating lesion within the left lobe of the liver (image 28, series 2) is too small to accurately characterize but may represent hepatic cysts.  The gallbladder is decompressed and otherwise normal.  No intra or extrahepatic biliary ductal dilatation.  No ascites.  There is symmetric enhancement of the bilateral kidneys.  No discrete renal lesion.  No definite renal stones on the post contrast examination.  No urinary obstruction or perinephric stranding.   A partially thrombosed right sided gonadal vein is incidentally noted (images 53 and 60, series 2). No definite ureteral stones.  Normal appearance of the bilateral adrenal glands, pancreas and spleen.  Ingested enteric contrast is seen primarily within the stomach and proximal small bowel.  There is mild dilatation of several loops of the mid small bowel with apparent transition point within the midline of the lower abdomen (axial image 47, series 2, coronal image 50).  This finding is associated with decompression of the downstream of small bowel and is worrisome for a least partial small bowel obstruction.  Minimal colonic stool burden.  Colonic diverticulosis without evidence of diverticulitis.  Normal appearance of the appendix.  No pneumoperitoneum, pneumatosis or portal venous gas.  Normal caliber of the abdominal aorta.  The major branch vessels of the abdominal aorta are patent on this non CTA examination.  No definite  retroperitoneal, mesenteric, pelvic or inguinal lymphadenopathy.  Normal appearance of the uterus.  There is a possible 2.0 x 1.5 cm left-sided hypoattenuating ( approximately 20 HU) presumably physiologic adnexal cyst (image 76, series 2).  No discrete right- sided adnexal lesion.  No free fluid in the pelvis.  Limited visualization of the lower thorax demonstrates minimal bibasilar atelectasis.  No focal airspace opacities or pleural effusion.  Normal heart size.  No pericardial effusion.  No acute or aggressive osseous abnormalities.  IMPRESSION:  1.  Findings worrisome for possible developing at least partial small bowel obstruction with apparent transition point within the midline of the lower abdomen.  As an etiology of this obstruction is not depicted, this is presumably secondary to adhesions.  2.  Possible 2.0 cm presumed physiologic left adnexal cyst.  This is almost certainly benign, and no  specific imaging follow up is recommended according to the Society of Radiologists in Ultrasound 2010 Consensus  Conference Statement (D Lavonda Jumbo al. Management of Asymptomatic Ovarian and Other Adnexal Cysts Imaged at [place]:  Society of Radiologists in Ultrasound Consensus Conference Statement 2010. Radiology 256 (Sept 2010): 943-954.).   Original Report Authenticated By: Tacey Ruiz, MD     Assessment/Plan Principal Problem:   Abdominal pain   1. Abdominal pain - suspect early small bowel obstruction. At this time I have placed patient n.p.o. We will repeat KUB in a.m. If patient's symptoms improve and KUB is unremarkable then advanced at discharge home. If symptoms persist or KUB is abnormal then may consider surgery consult. 2. History of GERD - place on IV Protonix for now.    Code Status: Full code.  Family Communication: None.  Disposition Plan: Admit to inpatient.    Joshaua Epple N. [facility] Pager [phone].  If 7PM-7AM, please contact night-coverage [email or web address] Password  [facility] 05/11/2013, 10:29 PM

## 2013-05-12 ENCOUNTER — Encounter (HOSPITAL_COMMUNITY): Payer: Self-pay

## 2013-05-12 ENCOUNTER — Inpatient Hospital Stay (HOSPITAL_COMMUNITY): Payer: BC Managed Care – PPO

## 2013-05-12 DIAGNOSIS — E876 Hypokalemia: Secondary | ICD-10-CM

## 2013-05-12 DIAGNOSIS — K56609 Unspecified intestinal obstruction, unspecified as to partial versus complete obstruction: Secondary | ICD-10-CM

## 2013-05-12 LAB — COMPREHENSIVE METABOLIC PANEL
ALT: 11 U/L (ref 0–35)
AST: 12 U/L (ref 0–37)
Alkaline Phosphatase: 132 U/L — ABNORMAL HIGH (ref 39–117)
CO2: 26 mEq/L (ref 19–32)
Calcium: 9.4 mg/dL (ref 8.4–10.5)
GFR calc non Af Amer: >90 mL/min (ref >90)
Potassium: 3.4 mEq/L — ABNORMAL LOW (ref 3.5–5.1)
Sodium: 140 mEq/L (ref 135–145)
Total Protein: 7.3 g/dL (ref 6.0–8.3)

## 2013-05-12 LAB — CBC WITH DIFFERENTIAL/PLATELET
Basophils Relative: 0 % (ref 0–1)
Eosinophils Absolute: 0.1 10*3/uL (ref 0.0–0.7)
HCT: 40.4 % (ref 36.0–46.0)
Hemoglobin: 13.2 g/dL (ref 12.0–15.0)
MCH: 26.2 pg (ref 26.0–34.0)
MCHC: 32.7 g/dL (ref 30.0–36.0)
Monocytes Absolute: 0.5 10*3/uL (ref 0.1–1.0)
Monocytes Relative: 7 % (ref 3–12)

## 2013-05-12 LAB — RAPID URINE DRUG SCREEN, HOSP PERFORMED
Barbiturates: NOT DETECTED
Benzodiazepines: NOT DETECTED

## 2013-05-12 MED ORDER — POTASSIUM CHLORIDE IN NACL 20-0.9 MEQ/L-% IV SOLN
INTRAVENOUS | Status: DC
  Administered 2013-05-12: 13:00:00 via INTRAVENOUS
  Filled 2013-05-12 (×6): qty 1000

## 2013-05-12 MED ORDER — MORPHINE SULFATE 4 MG/ML IJ SOLN: 4.0000 mg | INTRAMUSCULAR | Status: DC | PRN

## 2013-05-12 MED ORDER — POLYETHYLENE GLYCOL 3350 17 G PO PACK
17.0000 g | PACK | Freq: Every day | ORAL | Status: DC
  Administered 2013-05-12 – 2013-05-13 (×2): 17 g via ORAL
  Filled 2013-05-12 (×3): qty 1

## 2013-05-12 MED ORDER — DOCUSATE SODIUM 100 MG PO CAPS
100.0000 mg | ORAL_CAPSULE | Freq: Two times a day (BID) | ORAL | Status: DC
  Administered 2013-05-12 – 2013-05-13 (×3): 100 mg via ORAL
  Filled 2013-05-12 (×4): qty 1

## 2013-05-12 MED ORDER — MORPHINE SULFATE 2 MG/ML IJ SOLN
2.0000 mg | INTRAMUSCULAR | Status: DC | PRN
  Administered 2013-05-12 – 2013-05-13 (×8): 2 mg via INTRAVENOUS
  Filled 2013-05-12 (×8): qty 1

## 2013-05-12 NOTE — Progress Notes (Signed)
Complains of severe abdominal pain and cramping.  No increase in nausea noted and abdomen is soft.  MD notified and orders received.

## 2013-05-12 NOTE — Progress Notes (Signed)
UR COMPLETED  

## 2013-05-12 NOTE — Progress Notes (Signed)
[facility] PROGRESS NOTE  NIZHONI PARLOW ZOX:[id number] DOB: 1975-11-09 DOA: 05/11/2013 PCP: Pcp Not In System  Assessment/Plan: Abdominal pain - suspect early small bowel obstruction., clears, KUB stable, passing gas  History of GERD - place on IV Protonix for now Constipation- miralax, colase, encourage ambulation  Code Status: full Family Communication: patient Disposition Plan: home soon   Consultants:    Procedures:  Antibiotics:    HPI/Subjective: Still with abd pain  Objective: Filed Vitals:   05/12/13 0555  BP: 118/58  Pulse: 73  Temp: 98.1 F (36.7 C)  Resp: 16    Intake/Output Summary (Last 24 hours) at 05/12/13 1119 Last data filed at 05/12/13 1000  Gross per 24 hour  Intake   1245 ml  Output      0 ml  Net   1245 ml   Filed Weights   05/11/13 2250  Weight: 87.454 kg (192 lb 12.8 oz)    Exam:  General: Well-developed and nourished.   Cardiovascular: S1-S2 heard.  Respiratory: No rhonchi or crepitations.  Abdomen: Soft nontender bowel sounds hypoactive.  Skin: No rash.  Musculoskeletal: No edema.      Data Reviewed: Basic Metabolic Panel:  Recent Labs Lab 05/11/13 1703 05/12/13 0530  NA 140 140  K 3.6 3.4*  CL 103 103  CO2 26 26  GLUCOSE 96 119*  BUN 6 8  CREATININE 0.68 0.66  CALCIUM 9.3 9.4   Liver Function Tests:  Recent Labs Lab 05/11/13 1703 05/12/13 0530  AST 13 12  ALT 12 11  ALKPHOS 136* 132*  BILITOT 0.3 0.5  PROT 7.6 7.3  ALBUMIN 3.7 3.5    Recent Labs Lab 05/11/13 1703  LIPASE 56   No results found for this basename: AMMONIA,  in the last 168 hours CBC:  Recent Labs Lab 05/11/13 1703 05/12/13 0530  WBC 9.0 6.7  NEUTROABS 5.0 4.3  HGB 13.9 13.2  HCT 40.7 40.4  MCV 80.0 80.3  PLT 258 236   Cardiac Enzymes: No results found for this basename: CKTOTAL, CKMB, CKMBINDEX, TROPONINI,  in the last 168 hours BNP (last 3 results) No results found for this basename: PROBNP,  in the last  8760 hours CBG: No results found for this basename: GLUCAP,  in the last 168 hours  No results found for this or any previous visit (from the past 240 hour(s)).   Studies: Dg Abd 1 View  05/12/2013   *RADIOLOGY REPORT*  Clinical Data: Small bowel obstruction  ABDOMEN - 1 VIEW  Comparison: 05/11/2013  Findings: Mildly prominent small bowel loops are noted in the left abdomen consistent with the history of partial small bowel obstruction.  Contrast material is seen within the bladder from recent CT.  No free air is seen.  No abnormal mass is noted.  IMPRESSION: Stable mildly dilated small bowel.   Original Report Authenticated By: Alcide Clever, M.D.   Ct Abdomen Pelvis W Contrast  05/11/2013   *RADIOLOGY REPORT*  Clinical Data: Mid abdominal pain, left lower abdominal quadrant pain, back pain, nausea  CT ABDOMEN AND PELVIS WITH CONTRAST  Technique:  Multidetector CT imaging of the abdomen and pelvis was performed following the standard protocol during bolus administration of intravenous contrast.  Contrast: OMNIPAQUE IOHEXOL 300 MG/ML  SOLN  Comparison: None.  Findings:  Normal hepatic contour. An approximately 7 cm hypoattenuating lesion within the left lobe of the liver (image 28, series 2) is too small to accurately characterize but may represent hepatic cysts.  The  gallbladder is decompressed and otherwise normal.  No intra or extrahepatic biliary ductal dilatation.  No ascites.  There is symmetric enhancement of the bilateral kidneys.  No discrete renal lesion.  No definite renal stones on the post contrast examination.  No urinary obstruction or perinephric stranding.   A partially thrombosed right sided gonadal vein is incidentally noted (images 53 and 60, series 2). No definite ureteral stones.  Normal appearance of the bilateral adrenal glands, pancreas and spleen.  Ingested enteric contrast is seen primarily within the stomach and proximal small bowel.  There is mild dilatation of several  loops of the mid small bowel with apparent transition point within the midline of the lower abdomen (axial image 47, series 2, coronal image 50).  This finding is associated with decompression of the downstream of small bowel and is worrisome for a least partial small bowel obstruction.  Minimal colonic stool burden.  Colonic diverticulosis without evidence of diverticulitis.  Normal appearance of the appendix.  No pneumoperitoneum, pneumatosis or portal venous gas.  Normal caliber of the abdominal aorta.  The major branch vessels of the abdominal aorta are patent on this non CTA examination.  No definite retroperitoneal, mesenteric, pelvic or inguinal lymphadenopathy.  Normal appearance of the uterus.  There is a possible 2.0 x 1.5 cm left-sided hypoattenuating ( approximately 20 HU) presumably physiologic adnexal cyst (image 76, series 2).  No discrete right- sided adnexal lesion.  No free fluid in the pelvis.  Limited visualization of the lower thorax demonstrates minimal bibasilar atelectasis.  No focal airspace opacities or pleural effusion.  Normal heart size.  No pericardial effusion.  No acute or aggressive osseous abnormalities.  IMPRESSION:  1.  Findings worrisome for possible developing at least partial small bowel obstruction with apparent transition point within the midline of the lower abdomen.  As an etiology of this obstruction is not depicted, this is presumably secondary to adhesions.  2.  Possible 2.0 cm presumed physiologic left adnexal cyst.  This is almost certainly benign, and no specific imaging follow up is recommended according to the Society of Radiologists in Ultrasound 2010 Consensus  Conference Statement (D Lenis Noon et al. Management of Asymptomatic Ovarian and Other Adnexal Cysts Imaged at [place]:  Society of Radiologists in Ultrasound Consensus Conference Statement 2010. Radiology 256 (Sept 2010): 943-954.).   Original Report Authenticated By: Tacey Ruiz, MD    Scheduled Meds: .  acyclovir  800 mg Oral Daily  . docusate sodium  100 mg Oral BID  . pantoprazole (PROTONIX) IV  40 mg Intravenous QHS  . polyethylene glycol  17 g Oral Daily   Continuous Infusions: . 0.9 % NaCl with KCl 20 mEq / L      Principal Problem:   Abdominal pain    Time spent: 35    [facility], JESSICA  [facility] Pager [phone]. If 7PM-7AM, please contact night-coverage at [email or web address], password [facility] 05/12/2013, 11:19 AM  LOS: 1 day

## 2013-05-12 NOTE — ED Provider Notes (Signed)
Medical screening examination/treatment/procedure(s) were performed by non-physician practitioner and as supervising physician I was immediately available for consultation/collaboration.   Shanna Cisco, MD 05/12/13 [phone]

## 2013-05-13 LAB — BASIC METABOLIC PANEL
Calcium: 8.3 mg/dL — ABNORMAL LOW (ref 8.4–10.5)
GFR calc non Af Amer: >90 mL/min (ref >90)
Sodium: 142 mEq/L (ref 135–145)

## 2013-05-13 LAB — CBC
MCH: 26.6 pg (ref 26.0–34.0)
MCHC: 32.8 g/dL (ref 30.0–36.0)
Platelets: 196 10*3/uL (ref 150–400)
RDW: 13 % (ref 11.5–15.5)

## 2013-05-13 MED ORDER — BISACODYL 10 MG RE SUPP
10.0000 mg | Freq: Every day | RECTAL | Status: DC | PRN
  Administered 2013-05-13: 10 mg via RECTAL
  Filled 2013-05-13: qty 1

## 2013-05-13 MED ORDER — LACTULOSE 10 GM/15ML PO SOLN
30.0000 g | Freq: Once | ORAL | Status: AC
  Administered 2013-05-13: 30 g via ORAL
  Filled 2013-05-13 (×2): qty 45

## 2013-05-13 MED ORDER — TRAMADOL HCL 50 MG PO TABS
50.0000 mg | ORAL_TABLET | Freq: Four times a day (QID) | ORAL | Status: DC | PRN
  Administered 2013-05-13: 50 mg via ORAL
  Filled 2013-05-13: qty 1

## 2013-05-13 MED ORDER — TRAMADOL HCL 50 MG PO TABS: 50.0000 mg | ORAL_TABLET | Freq: Four times a day (QID) | ORAL | Status: DC | PRN

## 2013-05-13 MED ORDER — BISACODYL 10 MG RE SUPP: 10.0000 mg | Freq: Every day | RECTAL | Status: DC | PRN

## 2013-05-13 MED ORDER — POLYETHYLENE GLYCOL 3350 17 G PO PACK: 17.0000 g | PACK | Freq: Every day | ORAL | Status: DC

## 2013-05-13 MED ORDER — DSS 100 MG PO CAPS: 100.0000 mg | ORAL_CAPSULE | Freq: Two times a day (BID) | ORAL | Status: DC

## 2013-05-13 NOTE — Discharge Summary (Signed)
Physician Discharge Summary  APRYLL HINKLE ZOX:[id number] DOB: 1976/08/07 DOA: 05/11/2013  PCP: Pcp Not In System  Admit date: 05/11/2013 Discharge date: 05/13/2013  Time spent: 35 minutes  Recommendations for Outpatient Follow-up:   Discharge Diagnoses:  Principal Problem:   Abdominal pain   Discharge Condition: improved  Diet recommendation: cardiac  Filed Weights   05/11/13 2250  Weight: 87.454 kg (192 lb 12.8 oz)    History of present illness:  Kristina Castro is a 37 y.o. female who has chronic epigastric discomfort and is being treated for GERD presents with complaints of increasing abdominal pain from usual. The pain is at this time in the lower quadrants and radiating to the back. Patient has had some nausea but denies any vomiting. The symptoms started off in the morning. Denies any diarrhea and has had bowel movement yesterday. In the ER CT abdomen and pelvis shows features concerning for early small bowel obstruction. Patient has been admitted for further management. Patient states she has not had any surgeries in the abdomen. Patient denies any chest pain or short of breath.   Hospital Course:  Constipation:   With bowel rest, colase/lactulose- patient had BM; abd pain went away  Procedures:  none  Consultations:  none  Discharge Exam: Filed Vitals:   05/13/13 0644  BP: 103/51  Pulse: 79  Temp: 98.3 F (36.8 C)  Resp: 16    General: A+Ox3, NAD Cardiovascular: rrr Respiratory: clear  Discharge Instructions  Discharge Orders   Future Orders Complete By Expires   Diet general  As directed    Discharge instructions  As directed    Comments:     Be sure to have BM daily   Increase activity slowly  As directed        Medication List         acetaminophen 500 MG tablet  Commonly known as:  TYLENOL  Take 1,000 mg by mouth every 6 (six) hours as needed for pain.     acyclovir 800 MG tablet  Commonly known as:  ZOVIRAX  Take 800 mg by mouth  daily.     bisacodyl 10 MG suppository  Commonly known as:  DULCOLAX  Place 1 suppository (10 mg total) rectally daily as needed.     DSS 100 MG Caps  Take 100 mg by mouth 2 (two) times daily.     omeprazole 20 MG capsule  Commonly known as:  PRILOSEC  Take 40 mg by mouth daily as needed. Used for gastritis.     polyethylene glycol packet  Commonly known as:  MIRALAX / GLYCOLAX  Take 17 g by mouth daily.     traMADol 50 MG tablet  Commonly known as:  ULTRAM  Take 1 tablet (50 mg total) by mouth every 6 (six) hours as needed.       No Known Allergies    The results of significant diagnostics from this hospitalization (including imaging, microbiology, ancillary and laboratory) are listed below for reference.    Significant Diagnostic Studies: Dg Abd 1 View  05/12/2013   *RADIOLOGY REPORT*  Clinical Data: Small bowel obstruction  ABDOMEN - 1 VIEW  Comparison: 05/11/2013  Findings: Mildly prominent small bowel loops are noted in the left abdomen consistent with the history of partial small bowel obstruction.  Contrast material is seen within the bladder from recent CT.  No free air is seen.  No abnormal mass is noted.  IMPRESSION: Stable mildly dilated small bowel.   Original Report Authenticated  By: Alcide Clever, M.D.   Ct Abdomen Pelvis W Contrast  05/11/2013   *RADIOLOGY REPORT*  Clinical Data: Mid abdominal pain, left lower abdominal quadrant pain, back pain, nausea  CT ABDOMEN AND PELVIS WITH CONTRAST  Technique:  Multidetector CT imaging of the abdomen and pelvis was performed following the standard protocol during bolus administration of intravenous contrast.  Contrast: OMNIPAQUE IOHEXOL 300 MG/ML  SOLN  Comparison: None.  Findings:  Normal hepatic contour. An approximately 7 cm hypoattenuating lesion within the left lobe of the liver (image 28, series 2) is too small to accurately characterize but may represent hepatic cysts.  The gallbladder is decompressed and otherwise  normal.  No intra or extrahepatic biliary ductal dilatation.  No ascites.  There is symmetric enhancement of the bilateral kidneys.  No discrete renal lesion.  No definite renal stones on the post contrast examination.  No urinary obstruction or perinephric stranding.   A partially thrombosed right sided gonadal vein is incidentally noted (images 53 and 60, series 2). No definite ureteral stones.  Normal appearance of the bilateral adrenal glands, pancreas and spleen.  Ingested enteric contrast is seen primarily within the stomach and proximal small bowel.  There is mild dilatation of several loops of the mid small bowel with apparent transition point within the midline of the lower abdomen (axial image 47, series 2, coronal image 50).  This finding is associated with decompression of the downstream of small bowel and is worrisome for a least partial small bowel obstruction.  Minimal colonic stool burden.  Colonic diverticulosis without evidence of diverticulitis.  Normal appearance of the appendix.  No pneumoperitoneum, pneumatosis or portal venous gas.  Normal caliber of the abdominal aorta.  The major branch vessels of the abdominal aorta are patent on this non CTA examination.  No definite retroperitoneal, mesenteric, pelvic or inguinal lymphadenopathy.  Normal appearance of the uterus.  There is a possible 2.0 x 1.5 cm left-sided hypoattenuating ( approximately 20 HU) presumably physiologic adnexal cyst (image 76, series 2).  No discrete right- sided adnexal lesion.  No free fluid in the pelvis.  Limited visualization of the lower thorax demonstrates minimal bibasilar atelectasis.  No focal airspace opacities or pleural effusion.  Normal heart size.  No pericardial effusion.  No acute or aggressive osseous abnormalities.  IMPRESSION:  1.  Findings worrisome for possible developing at least partial small bowel obstruction with apparent transition point within the midline of the lower abdomen.  As an etiology of  this obstruction is not depicted, this is presumably secondary to adhesions.  2.  Possible 2.0 cm presumed physiologic left adnexal cyst.  This is almost certainly benign, and no specific imaging follow up is recommended according to the Society of Radiologists in Ultrasound 2010 Consensus  Conference Statement (D Lenis Noon et al. Management of Asymptomatic Ovarian and Other Adnexal Cysts Imaged at [place]:  Society of Radiologists in Ultrasound Consensus Conference Statement 2010. Radiology 256 (Sept 2010): 943-954.).   Original Report Authenticated By: Tacey Ruiz, MD    Microbiology: No results found for this or any previous visit (from the past 240 hour(s)).   Labs: Basic Metabolic Panel:  Recent Labs Lab 05/11/13 1703 05/12/13 0530 05/13/13 0635  NA 140 140 142  K 3.6 3.4* 3.8  CL 103 103 110  CO2 26 26 26   GLUCOSE 96 119* 96  BUN 6 8 5*  CREATININE 0.68 0.66 0.68  CALCIUM 9.3 9.4 8.3*   Liver Function Tests:  Recent Labs Lab  05/11/13 1703 05/12/13 0530  AST 13 12  ALT 12 11  ALKPHOS 136* 132*  BILITOT 0.3 0.5  PROT 7.6 7.3  ALBUMIN 3.7 3.5    Recent Labs Lab 05/11/13 1703  LIPASE 56   No results found for this basename: AMMONIA,  in the last 168 hours CBC:  Recent Labs Lab 05/11/13 1703 05/12/13 0530 05/13/13 0635  WBC 9.0 6.7 6.6  NEUTROABS 5.0 4.3  --   HGB 13.9 13.2 11.5*  HCT 40.7 40.4 35.1*  MCV 80.0 80.3 81.1  PLT 258 236 196   Cardiac Enzymes: No results found for this basename: CKTOTAL, CKMB, CKMBINDEX, TROPONINI,  in the last 168 hours BNP: BNP (last 3 results) No results found for this basename: PROBNP,  in the last 8760 hours CBG: No results found for this basename: GLUCAP,  in the last 168 hours     Signed:  Marlin Canary  [facility] 05/13/2013, 1:05 PM

## 2013-05-27 ENCOUNTER — Other Ambulatory Visit: Payer: Self-pay | Admitting: Gastroenterology

## 2013-05-27 DIAGNOSIS — R1013 Epigastric pain: Secondary | ICD-10-CM

## 2013-06-19 ENCOUNTER — Ambulatory Visit (HOSPITAL_COMMUNITY)
Admission: RE | Admit: 2013-06-19 | Discharge: 2013-06-19 | Disposition: A | Discharge status: Home / Self Care | Payer: BC Managed Care – PPO | Source: Ambulatory Visit | Attending: Gastroenterology | Admitting: Gastroenterology

## 2013-06-19 ENCOUNTER — Encounter (HOSPITAL_COMMUNITY)
Admission: RE | Admit: 2013-06-19 | Discharge: 2013-06-19 | Disposition: A | Discharge status: Home / Self Care | Payer: BC Managed Care – PPO | Source: Ambulatory Visit | Attending: Gastroenterology | Admitting: Gastroenterology

## 2013-06-19 DIAGNOSIS — R1013 Epigastric pain: Secondary | ICD-10-CM | POA: Insufficient documentation

## 2013-06-19 MED ORDER — TECHNETIUM TC 99M MEBROFENIN IV KIT
5.3000 | PACK | Freq: Once | INTRAVENOUS | Status: AC | PRN
  Administered 2013-06-19: 5 via INTRAVENOUS

## 2014-07-20 ENCOUNTER — Encounter (HOSPITAL_COMMUNITY): Payer: Self-pay

## 2014-10-01 ENCOUNTER — Ambulatory Visit (INDEPENDENT_AMBULATORY_CARE_PROVIDER_SITE_OTHER): Payer: Commercial Managed Care - PPO

## 2014-10-01 ENCOUNTER — Ambulatory Visit (INDEPENDENT_AMBULATORY_CARE_PROVIDER_SITE_OTHER): Payer: Commercial Managed Care - PPO | Admitting: Family Medicine

## 2014-10-01 VITALS — BP 132/85 | HR 84 | Temp 98.7°F | Resp 16 | Ht 64.0 in | Wt 200.1 lb

## 2014-10-01 DIAGNOSIS — T148 Other injury of unspecified body region: Secondary | ICD-10-CM

## 2014-10-01 DIAGNOSIS — M25511 Pain in right shoulder: Secondary | ICD-10-CM

## 2014-10-01 DIAGNOSIS — M542 Cervicalgia: Secondary | ICD-10-CM

## 2014-10-01 DIAGNOSIS — T148XXA Other injury of unspecified body region, initial encounter: Secondary | ICD-10-CM

## 2014-10-01 MED ORDER — CYCLOBENZAPRINE HCL 5 MG PO TABS: 5.0000 mg | ORAL_TABLET | Freq: Three times a day (TID) | ORAL | Status: DC | PRN

## 2014-10-01 MED ORDER — HYDROCODONE-ACETAMINOPHEN 5-325 MG PO TABS: 1.0000 | ORAL_TABLET | Freq: Four times a day (QID) | ORAL | Status: DC | PRN

## 2014-10-01 MED ORDER — MELOXICAM 7.5 MG PO TABS: 7.5000 mg | ORAL_TABLET | Freq: Two times a day (BID) | ORAL | Status: DC

## 2014-10-01 NOTE — Patient Instructions (Signed)
Cervical Strain and Sprain (Whiplash) with Rehab Cervical strain and sprain are injuries that commonly occur with "whiplash" injuries. Whiplash occurs when the neck is forcefully whipped backward or forward, such as during a motor vehicle accident or during contact sports. The muscles, ligaments, tendons, discs, and nerves of the neck are susceptible to injury when this occurs. RISK FACTORS Risk of having a whiplash injury increases if:  Osteoarthritis of the spine.  Situations that make head or neck accidents or trauma more likely.  High-risk sports (football, rugby, wrestling, hockey, auto racing, gymnastics, diving, contact karate, or boxing).  Poor strength and flexibility of the neck.  Previous neck injury.  Poor tackling technique.  Improperly fitted or padded equipment. SYMPTOMS   Pain or stiffness in the front or back of neck or both.  Symptoms may present immediately or up to 24 hours after injury.  Dizziness, headache, nausea, and vomiting.  Muscle spasm with soreness and stiffness in the neck.  Tenderness and swelling at the injury site. PREVENTION  Learn and use proper technique (avoid tackling with the head, spearing, and head-butting; use proper falling techniques to avoid landing on the head).  Warm up and stretch properly before activity.  Maintain physical fitness:  Strength, flexibility, and endurance.  Cardiovascular fitness.  Wear properly fitted and padded protective equipment, such as padded soft collars, for participation in contact sports. PROGNOSIS  Recovery from cervical strain and sprain injuries is dependent on the extent of the injury. These injuries are usually curable in 1 week to 3 months with appropriate treatment.  RELATED COMPLICATIONS   Temporary numbness and weakness may occur if the nerve roots are damaged, and this may persist until the nerve has completely healed.  Chronic pain due to frequent recurrence of  symptoms.  Prolonged healing, especially if activity is resumed too soon (before complete recovery). TREATMENT  Treatment initially involves the use of ice and medication to help reduce pain and inflammation. It is also important to perform strengthening and stretching exercises and modify activities that worsen symptoms so the injury does not get worse. These exercises may be performed at home or with a therapist. For patients who experience severe symptoms, a soft, padded collar may be recommended to be worn around the neck.  Improving your posture may help reduce symptoms. Posture improvement includes pulling your chin and abdomen in while sitting or standing. If you are sitting, sit in a firm chair with your buttocks against the back of the chair. While sleeping, try replacing your pillow with a small towel rolled to 2 inches in diameter, or use a cervical pillow or soft cervical collar. Poor sleeping positions delay healing.  For patients with nerve root damage, which causes numbness or weakness, the use of a cervical traction apparatus may be recommended. Surgery is rarely necessary for these injuries. However, cervical strain and sprains that are present at birth (congenital) may require surgery. MEDICATION   If pain medication is necessary, nonsteroidal anti-inflammatory medications, such as aspirin and ibuprofen, or other minor pain relievers, such as acetaminophen, are often recommended.  Do not take pain medication for 7 days before surgery.  Prescription pain relievers may be given if deemed necessary by your caregiver. Use only as directed and only as much as you need. HEAT AND COLD:   Cold treatment (icing) relieves pain and reduces inflammation. Cold treatment should be applied for 10 to 15 minutes every 2 to 3 hours for inflammation and pain and immediately after any activity that aggravates   your symptoms. Use ice packs or an ice massage.  Heat treatment may be used prior to  performing the stretching and strengthening activities prescribed by your caregiver, physical therapist, or athletic trainer. Use a heat pack or a warm soak. SEEK MEDICAL CARE IF:   Symptoms get worse or do not improve in 2 weeks despite treatment.  New, unexplained symptoms develop (drugs used in treatment may produce side effects). EXERCISES RANGE OF MOTION (ROM) AND STRETCHING EXERCISES - Cervical Strain and Sprain These exercises may help you when beginning to rehabilitate your injury. In order to successfully resolve your symptoms, you must improve your posture. These exercises are designed to help reduce the forward-head and rounded-shoulder posture which contributes to this condition. Your symptoms may resolve with or without further involvement from your physician, physical therapist or athletic trainer. While completing these exercises, remember:   Restoring tissue flexibility helps normal motion to return to the joints. This allows healthier, less painful movement and activity.  An effective stretch should be held for at least 20 seconds, although you may need to begin with shorter hold times for comfort.  A stretch should never be painful. You should only feel a gentle lengthening or release in the stretched tissue. STRETCH- Axial Extensors  Lie on your back on the floor. You may bend your knees for comfort. Place a rolled-up hand towel or dish towel, about 2 inches in diameter, under the part of your head that makes contact with the floor.  Gently tuck your chin, as if trying to make a "double chin," until you feel a gentle stretch at the base of your head.  Hold __________ seconds. Repeat __________ times. Complete this exercise __________ times per day.  STRETCH - Axial Extension   Stand or sit on a firm surface. Assume a good posture: chest up, shoulders drawn back, abdominal muscles slightly tense, knees unlocked (if standing) and feet hip width apart.  Slowly retract your  chin so your head slides back and your chin slightly lowers. Continue to look straight ahead.  You should feel a gentle stretch in the back of your head. Be certain not to feel an aggressive stretch since this can cause headaches later.  Hold for __________ seconds. Repeat __________ times. Complete this exercise __________ times per day. STRETCH - Cervical Side Bend   Stand or sit on a firm surface. Assume a good posture: chest up, shoulders drawn back, abdominal muscles slightly tense, knees unlocked (if standing) and feet hip width apart.  Without letting your nose or shoulders move, slowly tip your right / left ear to your shoulder until your feel a gentle stretch in the muscles on the opposite side of your neck.  Hold __________ seconds. Repeat __________ times. Complete this exercise __________ times per day. STRETCH - Cervical Rotators   Stand or sit on a firm surface. Assume a good posture: chest up, shoulders drawn back, abdominal muscles slightly tense, knees unlocked (if standing) and feet hip width apart.  Keeping your eyes level with the ground, slowly turn your head until you feel a gentle stretch along the back and opposite side of your neck.  Hold __________ seconds. Repeat __________ times. Complete this exercise __________ times per day. RANGE OF MOTION - Neck Circles   Stand or sit on a firm surface. Assume a good posture: chest up, shoulders drawn back, abdominal muscles slightly tense, knees unlocked (if standing) and feet hip width apart.  Gently roll your head down and around from the   back of one shoulder to the back of the other. The motion should never be forced or painful.  Repeat the motion 10-20 times, or until you feel the neck muscles relax and loosen. Repeat __________ times. Complete the exercise __________ times per day. STRENGTHENING EXERCISES - Cervical Strain and Sprain These exercises may help you when beginning to rehabilitate your injury. They may  resolve your symptoms with or without further involvement from your physician, physical therapist, or athletic trainer. While completing these exercises, remember:   Muscles can gain both the endurance and the strength needed for everyday activities through controlled exercises.  Complete these exercises as instructed by your physician, physical therapist, or athletic trainer. Progress the resistance and repetitions only as guided.  You may experience muscle soreness or fatigue, but the pain or discomfort you are trying to eliminate should never worsen during these exercises. If this pain does worsen, stop and make certain you are following the directions exactly. If the pain is still present after adjustments, discontinue the exercise until you can discuss the trouble with your clinician. STRENGTH - Cervical Flexors, Isometric  Face a wall, standing about 6 inches away. Place a small pillow, a ball about 6-8 inches in diameter, or a folded towel between your forehead and the wall.  Slightly tuck your chin and gently push your forehead into the soft object. Push only with mild to moderate intensity, building up tension gradually. Keep your jaw and forehead relaxed.  Hold 10 to 20 seconds. Keep your breathing relaxed.  Release the tension slowly. Relax your neck muscles completely before you start the next repetition. Repeat __________ times. Complete this exercise __________ times per day. STRENGTH- Cervical Lateral Flexors, Isometric   Stand about 6 inches away from a wall. Place a small pillow, a ball about 6-8 inches in diameter, or a folded towel between the side of your head and the wall.  Slightly tuck your chin and gently tilt your head into the soft object. Push only with mild to moderate intensity, building up tension gradually. Keep your jaw and forehead relaxed.  Hold 10 to 20 seconds. Keep your breathing relaxed.  Release the tension slowly. Relax your neck muscles completely  before you start the next repetition. Repeat __________ times. Complete this exercise __________ times per day. STRENGTH - Cervical Extensors, Isometric   Stand about 6 inches away from a wall. Place a small pillow, a ball about 6-8 inches in diameter, or a folded towel between the back of your head and the wall.  Slightly tuck your chin and gently tilt your head back into the soft object. Push only with mild to moderate intensity, building up tension gradually. Keep your jaw and forehead relaxed.  Hold 10 to 20 seconds. Keep your breathing relaxed.  Release the tension slowly. Relax your neck muscles completely before you start the next repetition. Repeat __________ times. Complete this exercise __________ times per day. POSTURE AND BODY MECHANICS CONSIDERATIONS - Cervical Strain and Sprain Keeping correct posture when sitting, standing or completing your activities will reduce the stress put on different body tissues, allowing injured tissues a chance to heal and limiting painful experiences. The following are general guidelines for improved posture. Your physician or physical therapist will provide you with any instructions specific to your needs. While reading these guidelines, remember:  The exercises prescribed by your provider will help you have the flexibility and strength to maintain correct postures.  The correct posture provides the optimal environment for your joints to   work. All of your joints have less wear and tear when properly supported by a spine with good posture. This means you will experience a healthier, less painful body.  Correct posture must be practiced with all of your activities, especially prolonged sitting and standing. Correct posture is as important when doing repetitive low-stress activities (typing) as it is when doing a single heavy-load activity (lifting). PROLONGED STANDING WHILE SLIGHTLY LEANING FORWARD When completing a task that requires you to lean  forward while standing in one place for a long time, place either foot up on a stationary 2- to 4-inch high object to help maintain the best posture. When both feet are on the ground, the low back tends to lose its slight inward curve. If this curve flattens (or becomes too large), then the back and your other joints will experience too much stress, fatigue more quickly, and can cause pain.  RESTING POSITIONS Consider which positions are most painful for you when choosing a resting position. If you have pain with flexion-based activities (sitting, bending, stooping, squatting), choose a position that allows you to rest in a less flexed posture. You would want to avoid curling into a fetal position on your side. If your pain worsens with extension-based activities (prolonged standing, working overhead), avoid resting in an extended position such as sleeping on your stomach. Most people will find more comfort when they rest with their spine in a more neutral position, neither too rounded nor too arched. Lying on a non-sagging bed on your side with a pillow between your knees, or on your back with a pillow under your knees will often provide some relief. Keep in mind, being in any one position for a prolonged period of time, no matter how correct your posture, can still lead to stiffness. WALKING Walk with an upright posture. Your ears, shoulders, and hips should all line up. OFFICE WORK When working at a desk, create an environment that supports good, upright posture. Without extra support, muscles fatigue and lead to excessive strain on joints and other tissues. CHAIR:  A chair should be able to slide under your desk when your back makes contact with the back of the chair. This allows you to work closely.  The chair's height should allow your eyes to be level with the upper part of your monitor and your hands to be slightly lower than your elbows.  Body position:  Your feet should make contact with the  floor. If this is not possible, use a foot rest.  Keep your ears over your shoulders. This will reduce stress on your neck and low back. Document Released: 09/04/2005 Document Revised: 01/19/2014 Document Reviewed: 12/17/2008 [employer] Patient Information 2015 [employer]. This information is not intended to replace advice given to you by your health care provider. Make sure you discuss any questions you have with your health care provider.  

## 2014-10-01 NOTE — Progress Notes (Signed)
Chief Complaint:  Chief Complaint  Patient presents with  . Back Pain  . Neck Pain    HPI: Kristina Castro is a 39 y.o. 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 She denies any prior neck injuries. It is described as a sahrp nagging constant pain, worse with certain movements She has a hard time turnig her head to the right , better with left, worse with flexion. Extension makes it feel better. No rashes, HA, n/v/abd pain.  Neck and shoulder pain x 3 days, she was having problems x 1 day during christmas but resloved. No repetitiv emotion. She doe snot remember what may have made this worse. NKI. Otc Diclofenac with somerr  Past Medical History  Diagnosis Date  . Abnormal Pap smear   . Heart murmur   . HSV-2 (herpes simplex virus 2) infection 2007    currently taking meds.  . Anemia   . Gastritis without bleeding 1998    currently on prilosec 40 mg  . Gestational diabetes     not this pregnancy   Past Surgical History  Procedure Laterality Date  . Tooth extraction    . Wisdom tooth extraction    . No past surgeries    . Dilation and curettage of uterus     History   Social History  . Marital Status: Divorced    Spouse Name: N/A    Number of Children: N/A  . Years of Education: N/A   Social History Main Topics  . Smoking status: Current Some Day Smoker -- 0.25 packs/day for .5 years    Types: Cigars    Last Attempt to Quit: 06/14/2011  . Smokeless tobacco: Never Used  . Alcohol Use: No  . Drug Use: No  . Sexual Activity: Yes    Birth Control/ Protection: None   Other Topics Concern  . None   Social History Narrative   Family History  Problem Relation Age of Onset  . Anesthesia problems Neg Hx   . Cancer Mother     breast  . Diabetes Mother   . Hypertension Father   . Hypertension Paternal Aunt   . Stroke  Maternal Grandmother   . Diabetes Maternal Grandmother    No Known Allergies Prior to Admission medications   Medication Sig Start Date End Date Taking? Authorizing Provider  acyclovir (ZOVIRAX) 800 MG tablet Take 800 mg by mouth daily.     Yes Historical Provider, MD  etonogestrel (NEXPLANON) 68 MG IMPL implant Inject into the skin.    Historical Provider, MD     ROS: The patient denies fevers, chills, night sweats, unintentional weight loss, chest pain, palpitations, wheezing, dyspnea on exertion, nausea, vomiting, abdominal pain, dysuria, hematuria, melena, numbness, weakness, or tingling.  All other systems have been reviewed and were otherwise negative with the exception of those mentioned in the HPI and as above.    PHYSICAL EXAM: Filed Vitals:   10/01/14 1759  BP: 132/85  Pulse: 84  Temp: 98.7 F (37.1 C)  Resp: 16   Filed Vitals:   10/01/14 1759  Height: 5\' 4"  (1.626 m)  Weight: 200 lb 2 oz (90.776 kg)   Body mass index is 34.33 kg/(m^2).  General: Alert, no acute distress HEENT:  Normocephalic, atraumatic, oropharynx patent. EOMI, PERRLA Cardiovascular:  Regular rate  and rhythm, no rubs murmurs or gallops.  No Carotid bruits, radial pulse intact. No pedal edema.  Respiratory: Clear to auscultation bilaterally.  No wheezes, rales, or rhonchi.  No cyanosis, no use of accessory musculature GI: No organomegaly, abdomen is soft and non-tender, positive bowel sounds.  No masses. Skin: No rashes. Neurologic: Facial musculature symmetric. Psychiatric: Patient is appropriate throughout our interaction. Lymphatic: No cervical lymphadenopathy Musculoskeletal: Gait intact. Neck exam -decrease ROm to the right rotatition, flexion,  Tender in the right trapezius and scapular area +/- Spurling + paramsk tenderness  Midline of lumabr spine Decrease ROM 5/5 strength, 2/2 DTRs No saddle anesthesia +/- Straight leg negative Hip and knee exam--normal    LABS: Results for  orders placed or performed during the hospital encounter of 05/11/13  CBC with Differential  Result Value Ref Range   WBC 9.0 4.0 - 10.5 K/uL   RBC 5.09 3.87 - 5.11 MIL/uL   Hemoglobin 13.9 12.0 - 15.0 g/dL   HCT 40.7 36.0 - 46.0 %   MCV 80.0 78.0 - 100.0 fL   MCH 27.3 26.0 - 34.0 pg   MCHC 34.2 30.0 - 36.0 g/dL   RDW 12.9 11.5 - 15.5 %   Platelets 258 150 - 400 K/uL   Neutrophils Relative % 55 43 - 77 %   Neutro Abs 5.0 1.7 - 7.7 K/uL   Lymphocytes Relative 37 12 - 46 %   Lymphs Abs 3.3 0.7 - 4.0 K/uL   Monocytes Relative 5 3 - 12 %   Monocytes Absolute 0.5 0.1 - 1.0 K/uL   Eosinophils Relative 2 0 - 5 %   Eosinophils Absolute 0.2 0.0 - 0.7 K/uL   Basophils Relative 0 0 - 1 %   Basophils Absolute 0.0 0.0 - 0.1 K/uL  Comprehensive metabolic panel  Result Value Ref Range   Sodium 140 135 - 145 mEq/L   Potassium 3.6 3.5 - 5.1 mEq/L   Chloride 103 96 - 112 mEq/L   CO2 26 19 - 32 mEq/L   Glucose, Bld 96 70 - 99 mg/dL   BUN 6 6 - 23 mg/dL   Creatinine, Ser 0.68 0.50 - 1.10 mg/dL   Calcium 9.3 8.4 - 10.5 mg/dL   Total Protein 7.6 6.0 - 8.3 g/dL   Albumin 3.7 3.5 - 5.2 g/dL   AST 13 0 - 37 U/L   ALT 12 0 - 35 U/L   Alkaline Phosphatase 136 (H) 39 - 117 U/L   Total Bilirubin 0.3 0.3 - 1.2 mg/dL   GFR calc non Af Amer >90 >90 mL/min   GFR calc Af Amer >90 >90 mL/min  Lipase, blood  Result Value Ref Range   Lipase 56 11 - 59 U/L  Urinalysis, Routine w reflex microscopic  Result Value Ref Range   Color, Urine YELLOW YELLOW   APPearance CLOUDY (A) CLEAR   Specific Gravity, Urine 1.008 1.005 - 1.030   pH 6.5 5.0 - 8.0   Glucose, UA NEGATIVE NEGATIVE mg/dL   Hgb urine dipstick NEGATIVE NEGATIVE   Bilirubin Urine NEGATIVE NEGATIVE   Ketones, ur NEGATIVE NEGATIVE mg/dL   Protein, ur NEGATIVE NEGATIVE mg/dL   Urobilinogen, UA 0.2 0.0 - 1.0 mg/dL   Nitrite NEGATIVE NEGATIVE   Leukocytes, UA TRACE (A) NEGATIVE  Urine microscopic-add on  Result Value Ref Range   Squamous  Epithelial / LPF MANY (A) RARE   WBC, UA 0-2 <3 WBC/hpf   Urine-Other AMORPHOUS URATES/PHOSPHATES   Urine rapid drug screen (hosp  performed)  Result Value Ref Range   Opiates NONE DETECTED NONE DETECTED   Cocaine NONE DETECTED NONE DETECTED   Benzodiazepines NONE DETECTED NONE DETECTED   Amphetamines NONE DETECTED NONE DETECTED   Tetrahydrocannabinol NONE DETECTED NONE DETECTED   Barbiturates NONE DETECTED NONE DETECTED  Comprehensive metabolic panel  Result Value Ref Range   Sodium 140 135 - 145 mEq/L   Potassium 3.4 (L) 3.5 - 5.1 mEq/L   Chloride 103 96 - 112 mEq/L   CO2 26 19 - 32 mEq/L   Glucose, Bld 119 (H) 70 - 99 mg/dL   BUN 8 6 - 23 mg/dL   Creatinine, Ser 0.66 0.50 - 1.10 mg/dL   Calcium 9.4 8.4 - 10.5 mg/dL   Total Protein 7.3 6.0 - 8.3 g/dL   Albumin 3.5 3.5 - 5.2 g/dL   AST 12 0 - 37 U/L   ALT 11 0 - 35 U/L   Alkaline Phosphatase 132 (H) 39 - 117 U/L   Total Bilirubin 0.5 0.3 - 1.2 mg/dL   GFR calc non Af Amer >90 >90 mL/min   GFR calc Af Amer >90 >90 mL/min  CBC with Differential  Result Value Ref Range   WBC 6.7 4.0 - 10.5 K/uL   RBC 5.03 3.87 - 5.11 MIL/uL   Hemoglobin 13.2 12.0 - 15.0 g/dL   HCT 40.4 36.0 - 46.0 %   MCV 80.3 78.0 - 100.0 fL   MCH 26.2 26.0 - 34.0 pg   MCHC 32.7 30.0 - 36.0 g/dL   RDW 13.2 11.5 - 15.5 %   Platelets 236 150 - 400 K/uL   Neutrophils Relative % 65 43 - 77 %   Neutro Abs 4.3 1.7 - 7.7 K/uL   Lymphocytes Relative 27 12 - 46 %   Lymphs Abs 1.8 0.7 - 4.0 K/uL   Monocytes Relative 7 3 - 12 %   Monocytes Absolute 0.5 0.1 - 1.0 K/uL   Eosinophils Relative 2 0 - 5 %   Eosinophils Absolute 0.1 0.0 - 0.7 K/uL   Basophils Relative 0 0 - 1 %   Basophils Absolute 0.0 0.0 - 0.1 K/uL  CBC  Result Value Ref Range   WBC 6.6 4.0 - 10.5 K/uL   RBC 4.33 3.87 - 5.11 MIL/uL   Hemoglobin 11.5 (L) 12.0 - 15.0 g/dL   HCT 35.1 (L) 36.0 - 46.0 %   MCV 81.1 78.0 - 100.0 fL   MCH 26.6 26.0 - 34.0 pg   MCHC 32.8 30.0 - 36.0 g/dL   RDW 13.0  11.5 - 15.5 %   Platelets 196 150 - 400 K/uL  Basic metabolic panel  Result Value Ref Range   Sodium 142 135 - 145 mEq/L   Potassium 3.8 3.5 - 5.1 mEq/L   Chloride 110 96 - 112 mEq/L   CO2 26 19 - 32 mEq/L   Glucose, Bld 96 70 - 99 mg/dL   BUN 5 (L) 6 - 23 mg/dL   Creatinine, Ser 0.68 0.50 - 1.10 mg/dL   Calcium 8.3 (L) 8.4 - 10.5 mg/dL   GFR calc non Af Amer >90 >90 mL/min   GFR calc Af Amer >90 >90 mL/min  Pregnancy, urine POC  Result Value Ref Range   Preg Test, Ur NEGATIVE NEGATIVE  POCT i-Stat troponin I  Result Value Ref Range   Troponin i, poc 0.00 0.00 - 0.08 ng/mL   Comment 3             EKG/XRAY:  Primary read interpreted by Dr. Marin Comment at [facility]. Neg fx or dislocation    ASSESSMENT/PLAN: Encounter Diagnoses  Name Primary?  . Neck pain on right side Yes  . Right shoulder pain   . Sprain and strain    Mobic, norco, flexeril ROM exercises, if no improvement then PT F/u prn   Gross sideeffects, risk and benefits, and alternatives of medications d/w patient. Patient is aware that all medications have potential sideeffects and we are unable to predict every sideeffect or drug-drug interaction that may occur.  LE, Arnot, DO 10/02/2014 7:12 PM

## 2015-02-24 ENCOUNTER — Other Ambulatory Visit: Payer: Self-pay | Admitting: Obstetrics & Gynecology

## 2015-02-25 LAB — CYTOLOGY - PAP

## 2015-05-05 ENCOUNTER — Other Ambulatory Visit: Payer: Self-pay | Admitting: Nurse Practitioner

## 2015-05-05 DIAGNOSIS — N644 Mastodynia: Secondary | ICD-10-CM

## 2015-05-10 ENCOUNTER — Ambulatory Visit
Admission: RE | Admit: 2015-05-10 | Discharge: 2015-05-10 | Disposition: A | Discharge status: Home / Self Care | Payer: Commercial Managed Care - PPO | Source: Ambulatory Visit | Attending: Nurse Practitioner | Admitting: Nurse Practitioner

## 2015-05-10 DIAGNOSIS — N644 Mastodynia: Secondary | ICD-10-CM

## 2015-05-17 ENCOUNTER — Encounter (HOSPITAL_BASED_OUTPATIENT_CLINIC_OR_DEPARTMENT_OTHER): Payer: Self-pay | Admitting: *Deleted

## 2015-05-19 ENCOUNTER — Encounter (HOSPITAL_BASED_OUTPATIENT_CLINIC_OR_DEPARTMENT_OTHER): Payer: Self-pay | Admitting: *Deleted

## 2015-05-19 NOTE — Progress Notes (Signed)
NPO AFTER MN.  ARRIVE AT 0600.  NEEDS T & S.  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  WILL TAKE PRILOSEC AM DOS W/ SIPS OF WATER.

## 2015-05-20 DIAGNOSIS — N920 Excessive and frequent menstruation with regular cycle: Secondary | ICD-10-CM | POA: Diagnosis present

## 2015-05-20 DIAGNOSIS — N800 Endometriosis of uterus: Secondary | ICD-10-CM | POA: Diagnosis not present

## 2015-05-20 DIAGNOSIS — R000 Tachycardia, unspecified: Secondary | ICD-10-CM | POA: Diagnosis not present

## 2015-05-20 DIAGNOSIS — D649 Anemia, unspecified: Secondary | ICD-10-CM | POA: Diagnosis not present

## 2015-05-20 LAB — COMPREHENSIVE METABOLIC PANEL
ALBUMIN: 3.7 g/dL (ref 3.5–5.0)
ALT: 12 U/L — ABNORMAL LOW (ref 14–54)
ANION GAP: 6 (ref 5–15)
AST: 21 U/L (ref 15–41)
Alkaline Phosphatase: 91 U/L (ref 38–126)
BILIRUBIN TOTAL: 0.4 mg/dL (ref 0.3–1.2)
BUN: 8 mg/dL (ref 6–20)
CO2: 23 mmol/L (ref 22–32)
Calcium: 8.7 mg/dL — ABNORMAL LOW (ref 8.9–10.3)
Chloride: 107 mmol/L (ref 101–111)
Creatinine, Ser: 0.71 mg/dL (ref 0.44–1.00)
GFR calc Af Amer: >60 mL/min (ref >60)
GFR calc non Af Amer: >60 mL/min (ref >60)
GLUCOSE: 152 mg/dL — AB (ref 65–99)
POTASSIUM: 4 mmol/L (ref 3.5–5.1)
SODIUM: 136 mmol/L (ref 135–145)
TOTAL PROTEIN: 7.3 g/dL (ref 6.5–8.1)

## 2015-05-20 LAB — CBC
HCT: 38.6 % (ref 36.0–46.0)
Hemoglobin: 12.5 g/dL (ref 12.0–15.0)
MCH: 26.2 pg (ref 26.0–34.0)
MCHC: 32.4 g/dL (ref 30.0–36.0)
MCV: 80.8 fL (ref 78.0–100.0)
Platelets: 252 10*3/uL (ref 150–400)
RBC: 4.78 MIL/uL (ref 3.87–5.11)
RDW: 13 % (ref 11.5–15.5)
WBC: 8.1 10*3/uL (ref 4.0–10.5)

## 2015-05-22 NOTE — H&P (Signed)
Kristina Castro is an 39 y.o. female with heavy menstrual bleeding; declines continued hormonal tx.  Patient has completed childbearing and wants definitive management.  Ultrasound shows normal sized uterus with 1 cm fibroid.  Patient currently has nexplanon which expired last month.    Pertinent Gynecological History: Menses: irregular occurring approximately every 15 days without intermenstrual spotting Bleeding: dysfunctional uterine bleeding Contraception: Nexplanon DES exposure: unknown Blood transfusions: none Sexually transmitted diseases: HSV Previous GYN Procedures: DNC  Last mammogram: n/a Date: n/a Last pap: normal Date: 02/2015 OB History: G7, P4   Menstrual History: Menarche age: *n/a Patient's last menstrual period was 05/14/2015 (exact date).    Past Medical History  Diagnosis Date  . HSV-2 (herpes simplex virus 2) infection 2007    currently taking meds.  . History of gastritis     1998  . History of abnormal cervical Pap smear   . History of gestational diabetes   . History of small bowel obstruction     05-11-2013 resolved without surgical intervention  . Heavy menstrual bleeding   . GERD (gastroesophageal reflux disease)   . History of cardiac murmur     only during one pregancy--     Past Surgical History  Procedure Laterality Date  . Dilation and evacuation  x3   last one 2011    Family History  Problem Relation Age of Onset  . Anesthesia problems Neg Hx   . Cancer Mother     breast  . Diabetes Mother   . Hypertension Father   . Hypertension Paternal Aunt   . Stroke Maternal Grandmother   . Diabetes Maternal Grandmother     Social History:  reports that she has been smoking Cigars.  She has never used smokeless tobacco. She reports that she drinks about 4.2 oz of alcohol per week. She reports that she does not use illicit drugs.  Allergies:  Allergies  Allergen Reactions  . Adhesive [Tape] Rash    All tapes including paper tape    No  prescriptions prior to admission    ROS  Height 5\' 5"  (1.651 m), weight 202 lb (91.627 kg), last menstrual period 05/14/2015. Physical Exam  Constitutional: She is oriented to person, place, and time. She appears well-developed and well-nourished.  GI: Soft. There is no rebound and no guarding.  Neurological: She is alert and oriented to person, place, and time.  Skin: Skin is warm and dry.  Psychiatric: She has a normal mood and affect. Her behavior is normal.    No results found for this or any previous visit (from the past 24 hour(s)).  No results found.  Assessment/Plan: 39yo G7P4 with heavy menstrual bleeding desiring definitive management -LAVH, b/l salpingectomy, Nexplanon removal, possible ant/post repair, SSLS -Patient was counseled re: risk of bleeding, infection, scarring, and damage to surrounding structures.  All questions were answered and the patient wishes to proceed.  Westen Dinino, [place] 05/22/2015, 9:34 PM

## 2015-05-25 ENCOUNTER — Ambulatory Visit (HOSPITAL_BASED_OUTPATIENT_CLINIC_OR_DEPARTMENT_OTHER): Payer: Commercial Managed Care - PPO | Admitting: Anesthesiology

## 2015-05-25 ENCOUNTER — Observation Stay (HOSPITAL_BASED_OUTPATIENT_CLINIC_OR_DEPARTMENT_OTHER)
Admission: RE | Admit: 2015-05-25 | Discharge: 2015-05-27 | Disposition: A | Discharge status: Home / Self Care | Payer: Commercial Managed Care - PPO | Source: Ambulatory Visit | Attending: Obstetrics & Gynecology | Admitting: Obstetrics & Gynecology

## 2015-05-25 ENCOUNTER — Encounter (HOSPITAL_BASED_OUTPATIENT_CLINIC_OR_DEPARTMENT_OTHER): Payer: Self-pay | Admitting: *Deleted

## 2015-05-25 ENCOUNTER — Encounter (HOSPITAL_COMMUNITY)
Admission: RE | Disposition: A | Discharge status: Home / Self Care | Payer: Self-pay | Source: Ambulatory Visit | Attending: Obstetrics & Gynecology

## 2015-05-25 DIAGNOSIS — R000 Tachycardia, unspecified: Secondary | ICD-10-CM | POA: Insufficient documentation

## 2015-05-25 DIAGNOSIS — Z90710 Acquired absence of both cervix and uterus: Secondary | ICD-10-CM

## 2015-05-25 DIAGNOSIS — N800 Endometriosis of uterus: Secondary | ICD-10-CM | POA: Diagnosis not present

## 2015-05-25 DIAGNOSIS — N920 Excessive and frequent menstruation with regular cycle: Secondary | ICD-10-CM | POA: Insufficient documentation

## 2015-05-25 DIAGNOSIS — D649 Anemia, unspecified: Secondary | ICD-10-CM | POA: Insufficient documentation

## 2015-05-25 HISTORY — DX: Personal history of gestational diabetes: Z86.32

## 2015-05-25 HISTORY — PX: IUD REMOVAL: SHX5392

## 2015-05-25 HISTORY — DX: Personal history of other diseases of the circulatory system: Z86.79

## 2015-05-25 HISTORY — PX: BILATERAL SALPINGECTOMY: SHX5743

## 2015-05-25 HISTORY — DX: Excessive and frequent menstruation with regular cycle: N92.0

## 2015-05-25 HISTORY — DX: Personal history of other diseases of the female genital tract: Z87.42

## 2015-05-25 HISTORY — PX: LAPAROSCOPIC ASSISTED VAGINAL HYSTERECTOMY: SHX5398

## 2015-05-25 HISTORY — DX: Personal history of other diseases of the digestive system: Z87.19

## 2015-05-25 HISTORY — DX: Gastro-esophageal reflux disease without esophagitis: K21.9

## 2015-05-25 LAB — ABO/RH: ABO/RH(D): O POS

## 2015-05-25 LAB — POCT PREGNANCY, URINE: Preg Test, Ur: NEGATIVE

## 2015-05-25 SURGERY — HYSTERECTOMY, VAGINAL, LAPAROSCOPY-ASSISTED
Anesthesia: General | Site: Arm Upper

## 2015-05-25 MED ORDER — ONDANSETRON HCL 4 MG/2ML IJ SOLN
INTRAMUSCULAR | Status: DC | PRN
  Administered 2015-05-25: 4 mg via INTRAVENOUS

## 2015-05-25 MED ORDER — BUPIVACAINE HCL (PF) 0.25 % IJ SOLN
INTRAMUSCULAR | Status: DC | PRN
  Administered 2015-05-25: 6 mL

## 2015-05-25 MED ORDER — FENTANYL CITRATE (PF) 100 MCG/2ML IJ SOLN
INTRAMUSCULAR | Status: AC
  Filled 2015-05-25: qty 4

## 2015-05-25 MED ORDER — DOCUSATE SODIUM 100 MG PO CAPS
100.0000 mg | ORAL_CAPSULE | Freq: Two times a day (BID) | ORAL | Status: DC
  Administered 2015-05-25 – 2015-05-27 (×3): 100 mg via ORAL
  Filled 2015-05-25 (×6): qty 1

## 2015-05-25 MED ORDER — LIDOCAINE HCL (CARDIAC) 20 MG/ML IV SOLN
INTRAVENOUS | Status: DC | PRN
  Administered 2015-05-25: 70 mg via INTRAVENOUS

## 2015-05-25 MED ORDER — LACTATED RINGERS IV SOLN
INTRAVENOUS | Status: DC
  Administered 2015-05-25 (×4): via INTRAVENOUS
  Filled 2015-05-25: qty 1000

## 2015-05-25 MED ORDER — GLYCOPYRROLATE 0.2 MG/ML IJ SOLN
INTRAMUSCULAR | Status: DC | PRN
  Administered 2015-05-25: 0.4 mg via INTRAVENOUS

## 2015-05-25 MED ORDER — ONDANSETRON HCL 4 MG PO TABS
4.0000 mg | ORAL_TABLET | Freq: Four times a day (QID) | ORAL | Status: DC | PRN
  Administered 2015-05-27: 4 mg via ORAL
  Filled 2015-05-25: qty 1

## 2015-05-25 MED ORDER — CEFAZOLIN SODIUM-DEXTROSE 2-3 GM-% IV SOLR
2.0000 g | INTRAVENOUS | Status: AC
  Administered 2015-05-25: 2 g via INTRAVENOUS
  Filled 2015-05-25: qty 50

## 2015-05-25 MED ORDER — NEOSTIGMINE METHYLSULFATE 10 MG/10ML IV SOLN
INTRAVENOUS | Status: DC | PRN
  Administered 2015-05-25: 3 mg via INTRAVENOUS

## 2015-05-25 MED ORDER — HYDROMORPHONE HCL 1 MG/ML IJ SOLN
0.2000 mg | INTRAMUSCULAR | Status: DC | PRN
  Administered 2015-05-25 (×3): 0.5 mg via INTRAVENOUS
  Administered 2015-05-26 (×2): 0.6 mg via INTRAVENOUS
  Administered 2015-05-26: 0.5 mg via INTRAVENOUS
  Filled 2015-05-25 (×6): qty 1

## 2015-05-25 MED ORDER — HYDROMORPHONE HCL 1 MG/ML IJ SOLN
INTRAMUSCULAR | Status: AC
  Filled 2015-05-25: qty 1

## 2015-05-25 MED ORDER — KETOROLAC TROMETHAMINE 15 MG/ML IJ SOLN
15.0000 mg | Freq: Four times a day (QID) | INTRAMUSCULAR | Status: DC
  Administered 2015-05-25 – 2015-05-27 (×7): 15 mg via INTRAVENOUS
  Filled 2015-05-25 (×13): qty 1

## 2015-05-25 MED ORDER — MENTHOL 3 MG MT LOZG
1.0000 | LOZENGE | OROMUCOSAL | Status: DC | PRN
  Filled 2015-05-25 (×2): qty 9

## 2015-05-25 MED ORDER — SIMETHICONE 80 MG PO CHEW
80.0000 mg | CHEWABLE_TABLET | Freq: Four times a day (QID) | ORAL | Status: DC | PRN
  Filled 2015-05-25: qty 1

## 2015-05-25 MED ORDER — FENTANYL CITRATE (PF) 100 MCG/2ML IJ SOLN
INTRAMUSCULAR | Status: AC
  Filled 2015-05-25: qty 2

## 2015-05-25 MED ORDER — OXYCODONE HCL 5 MG PO TABS
5.0000 mg | ORAL_TABLET | Freq: Once | ORAL | Status: DC | PRN
  Filled 2015-05-25: qty 1

## 2015-05-25 MED ORDER — DEXAMETHASONE SODIUM PHOSPHATE 4 MG/ML IJ SOLN
INTRAMUSCULAR | Status: DC | PRN
  Administered 2015-05-25: 10 mg via INTRAVENOUS

## 2015-05-25 MED ORDER — LACTATED RINGERS IR SOLN
Status: DC | PRN
  Administered 2015-05-25: 3000 mL

## 2015-05-25 MED ORDER — MIDAZOLAM HCL 5 MG/5ML IJ SOLN
INTRAMUSCULAR | Status: DC | PRN
  Administered 2015-05-25: 2 mg via INTRAVENOUS

## 2015-05-25 MED ORDER — LACTATED RINGERS IV SOLN
INTRAVENOUS | Status: DC
  Administered 2015-05-25: 14:00:00 via INTRAVENOUS

## 2015-05-25 MED ORDER — ROCURONIUM BROMIDE 100 MG/10ML IV SOLN
INTRAVENOUS | Status: DC | PRN
  Administered 2015-05-25: 40 mg via INTRAVENOUS

## 2015-05-25 MED ORDER — FENTANYL CITRATE (PF) 100 MCG/2ML IJ SOLN
INTRAMUSCULAR | Status: DC | PRN
  Administered 2015-05-25: 100 ug via INTRAVENOUS
  Administered 2015-05-25 (×4): 50 ug via INTRAVENOUS

## 2015-05-25 MED ORDER — MIDAZOLAM HCL 2 MG/2ML IJ SOLN
INTRAMUSCULAR | Status: AC
  Filled 2015-05-25: qty 2

## 2015-05-25 MED ORDER — PROPOFOL 10 MG/ML IV BOLUS
INTRAVENOUS | Status: DC | PRN
  Administered 2015-05-25: 200 mg via INTRAVENOUS

## 2015-05-25 MED ORDER — ALBUMIN HUMAN 5 % IV SOLN
12.5000 g | Freq: Once | INTRAVENOUS | Status: AC
  Administered 2015-05-25: 12.5 g via INTRAVENOUS
  Filled 2015-05-25 (×2): qty 250

## 2015-05-25 MED ORDER — CEFAZOLIN SODIUM-DEXTROSE 2-3 GM-% IV SOLR
INTRAVENOUS | Status: AC
  Filled 2015-05-25: qty 50

## 2015-05-25 MED ORDER — ONDANSETRON HCL 4 MG/2ML IJ SOLN
4.0000 mg | Freq: Four times a day (QID) | INTRAMUSCULAR | Status: DC | PRN
  Filled 2015-05-25: qty 2

## 2015-05-25 MED ORDER — SUCCINYLCHOLINE CHLORIDE 20 MG/ML IJ SOLN
INTRAMUSCULAR | Status: DC | PRN
  Administered 2015-05-25: 100 mg via INTRAVENOUS

## 2015-05-25 MED ORDER — OXYCODONE-ACETAMINOPHEN 5-325 MG PO TABS
1.0000 | ORAL_TABLET | ORAL | Status: DC | PRN
  Administered 2015-05-26 – 2015-05-27 (×5): 2 via ORAL
  Filled 2015-05-25 (×5): qty 2

## 2015-05-25 MED ORDER — ONDANSETRON HCL 4 MG/2ML IJ SOLN
4.0000 mg | Freq: Four times a day (QID) | INTRAMUSCULAR | Status: DC | PRN
  Administered 2015-05-25 – 2015-05-26 (×2): 4 mg via INTRAVENOUS
  Filled 2015-05-25 (×2): qty 2

## 2015-05-25 MED ORDER — ACETAMINOPHEN 10 MG/ML IV SOLN
INTRAVENOUS | Status: DC | PRN
  Administered 2015-05-25: 1000 mg via INTRAVENOUS

## 2015-05-25 MED ORDER — FERROUS SULFATE 325 (65 FE) MG PO TABS
325.0000 mg | ORAL_TABLET | Freq: Two times a day (BID) | ORAL | Status: DC
  Administered 2015-05-25 – 2015-05-27 (×4): 325 mg via ORAL
  Filled 2015-05-25 (×7): qty 1

## 2015-05-25 MED ORDER — LACTATED RINGERS IV SOLN
INTRAVENOUS | Status: DC
  Filled 2015-05-25: qty 1000

## 2015-05-25 MED ORDER — HYDROMORPHONE HCL 1 MG/ML IJ SOLN
0.2500 mg | INTRAMUSCULAR | Status: DC | PRN
  Administered 2015-05-25 (×2): 0.25 mg via INTRAVENOUS
  Administered 2015-05-25: 0.5 mg via INTRAVENOUS
  Administered 2015-05-25 (×2): 0.25 mg via INTRAVENOUS
  Filled 2015-05-25: qty 1

## 2015-05-25 MED ORDER — OXYCODONE HCL 5 MG/5ML PO SOLN
5.0000 mg | Freq: Once | ORAL | Status: DC | PRN
  Filled 2015-05-25: qty 5

## 2015-05-25 SURGICAL SUPPLY — 100 items
APPLICATOR COTTON TIP 6IN STRL (MISCELLANEOUS) IMPLANT
BAG SPEC RTRVL LRG 6X4 10 (ENDOMECHANICALS)
BAG URINE DRAINAGE (UROLOGICAL SUPPLIES) ×6 IMPLANT
BANDAGE STRIP 1X3 FLEXIBLE (GAUZE/BANDAGES/DRESSINGS) ×3 IMPLANT
BLADE CLIPPER SURG (BLADE) IMPLANT
BLADE SURG 11 STRL SS (BLADE) ×9 IMPLANT
BNDG CONFORM 3 STRL LF (GAUZE/BANDAGES/DRESSINGS) ×3 IMPLANT
CABLE HIGH FREQUENCY MONO STRZ (ELECTRODE) IMPLANT
CANISTER SUCT 3000ML (MISCELLANEOUS) ×3 IMPLANT
CANISTER SUCTION 2500CC (MISCELLANEOUS) ×9 IMPLANT
CATH FOLEY 2WAY SLVR  5CC 14FR (CATHETERS) ×2
CATH FOLEY 2WAY SLVR 5CC 14FR (CATHETERS) ×4 IMPLANT
CATH ROBINSON RED A/P 16FR (CATHETERS) ×6 IMPLANT
CHLORAPREP W/TINT 26ML (MISCELLANEOUS) ×6 IMPLANT
CLOSURE WOUND 1/4X4 (GAUZE/BANDAGES/DRESSINGS)
COVER BACK TABLE 60X90IN (DRAPES) ×12 IMPLANT
COVER MAYO STAND STRL (DRAPES) ×6 IMPLANT
DECANTER SPIKE VIAL GLASS SM (MISCELLANEOUS) IMPLANT
DRAPE LG THREE QUARTER DISP (DRAPES) ×12 IMPLANT
DRAPE UNDERBUTTOCKS STRL (DRAPE) ×6 IMPLANT
DRSG COVADERM PLUS 2X2 (GAUZE/BANDAGES/DRESSINGS) ×6 IMPLANT
DRSG OPSITE POSTOP 3X4 (GAUZE/BANDAGES/DRESSINGS) IMPLANT
DURAPREP 26ML APPLICATOR (WOUND CARE) ×3 IMPLANT
ELECT LIGASURE LONG (ELECTRODE) IMPLANT
ELECT LIGASURE SHORT 9 REUSE (ELECTRODE) ×3 IMPLANT
ELECT REM PT RETURN 9FT ADLT (ELECTROSURGICAL)
ELECTRODE REM PT RTRN 9FT ADLT (ELECTROSURGICAL) IMPLANT
FILTER SMOKE EVAC LAPAROSHD (FILTER) ×3 IMPLANT
FORCEPS CUTTING 45CM 5MM (CUTTING FORCEPS) IMPLANT
GLOVE BIO SURGEON STRL SZ 6 (GLOVE) ×18 IMPLANT
GLOVE BIO SURGEON STRL SZ 6.5 (GLOVE) ×2 IMPLANT
GLOVE BIO SURGEON STRL SZ7.5 (GLOVE) ×6 IMPLANT
GLOVE BIO SURGEONS STRL SZ 6.5 (GLOVE) ×1
GLOVE BIOGEL PI IND STRL 6 (GLOVE) ×8 IMPLANT
GLOVE BIOGEL PI IND STRL 6.5 (GLOVE) ×1 IMPLANT
GLOVE BIOGEL PI IND STRL 7.0 (GLOVE) ×8 IMPLANT
GLOVE BIOGEL PI IND STRL 7.5 (GLOVE) ×2 IMPLANT
GLOVE BIOGEL PI INDICATOR 6 (GLOVE) ×4
GLOVE BIOGEL PI INDICATOR 6.5 (GLOVE) ×2
GLOVE BIOGEL PI INDICATOR 7.0 (GLOVE) ×4
GLOVE BIOGEL PI INDICATOR 7.5 (GLOVE) ×4
GLOVE ECLIPSE 6.5 STRL STRAW (GLOVE) ×6 IMPLANT
GLOVE LITE  25/BX (GLOVE) ×3 IMPLANT
GOWN STRL REUS W/ TWL LRG LVL3 (GOWN DISPOSABLE) ×12 IMPLANT
GOWN STRL REUS W/TWL LRG LVL3 (GOWN DISPOSABLE) ×24 IMPLANT
GOWN STRL REUS W/TWL XL LVL3 (GOWN DISPOSABLE) ×6 IMPLANT
HOLDER FOLEY CATH W/STRAP (MISCELLANEOUS) ×6 IMPLANT
IV LACTATED RINGER IRRG 3000ML (IV SOLUTION) ×6
IV LR IRRIG 3000ML ARTHROMATIC (IV SOLUTION) ×1 IMPLANT
LIGASURE LAP L-HOOKWIRE 5 44CM (INSTRUMENTS) ×3 IMPLANT
LIQUID BAND (GAUZE/BANDAGES/DRESSINGS) ×6 IMPLANT
MANIFOLD NEPTUNE II (INSTRUMENTS) IMPLANT
MANIPULATOR UTERINE 4.5 ZUMI (MISCELLANEOUS) IMPLANT
NDL HYPO 25X1 1.5 SAFETY (NEEDLE) ×3 IMPLANT
NDL INSUFFLATION 14GA 120MM (NEEDLE) IMPLANT
NDL INSUFFLATION 14GA 150MM (NEEDLE) IMPLANT
NDL SPNL 22GX3.5 QUINCKE BK (NEEDLE) IMPLANT
NEEDLE HYPO 25X1 1.5 SAFETY (NEEDLE) ×6 IMPLANT
NEEDLE INSUFFLATION 120MM (ENDOMECHANICALS) ×6 IMPLANT
NEEDLE INSUFFLATION 14GA 120MM (NEEDLE) ×6 IMPLANT
NEEDLE INSUFFLATION 14GA 150MM (NEEDLE) IMPLANT
NEEDLE SPNL 22GX3.5 QUINCKE BK (NEEDLE) IMPLANT
NS IRRIG 500ML POUR BTL (IV SOLUTION) ×6 IMPLANT
PACK BASIN DAY SURGERY FS (CUSTOM PROCEDURE TRAY) ×6 IMPLANT
PAD OB MATERNITY 4.3X12.25 (PERSONAL CARE ITEMS) ×6 IMPLANT
PAD PREP 24X48 CUFFED NSTRL (MISCELLANEOUS) ×6 IMPLANT
PADDING ION DISPOSABLE (MISCELLANEOUS) ×3 IMPLANT
PENCIL BUTTON HOLSTER BLD 10FT (ELECTRODE) ×6 IMPLANT
POUCH SPECIMEN RETRIEVAL 10MM (ENDOMECHANICALS) IMPLANT
SCISSORS LAP 5X35 DISP (ENDOMECHANICALS) IMPLANT
SEALER TISSUE G2 CVD JAW 45CM (ENDOMECHANICALS) IMPLANT
SET IRRIG TUBING LAPAROSCOPIC (IRRIGATION / IRRIGATOR) ×3 IMPLANT
SHEET LAVH (DRAPES) ×6 IMPLANT
SLEEVE XCEL OPT CAN 5 100 (ENDOMECHANICALS) ×6 IMPLANT
SOLUTION ANTI FOG 6CC (MISCELLANEOUS) ×6 IMPLANT
SPONGE GAUZE 4X4 12PLY STER LF (GAUZE/BANDAGES/DRESSINGS) ×3 IMPLANT
SPONGE LAP 4X18 X RAY DECT (DISPOSABLE) ×6 IMPLANT
STRIP CLOSURE SKIN 1/4X4 (GAUZE/BANDAGES/DRESSINGS) IMPLANT
SUT MNCRL 0 MO-4 VIOLET 18 CR (SUTURE) ×8 IMPLANT
SUT MNCRL AB 3-0 PS2 18 (SUTURE) ×6 IMPLANT
SUT MON AB 2-0 CT1 36 (SUTURE) ×6 IMPLANT
SUT MONOCRYL 0 MO 4 18  CR/8 (SUTURE) ×4
SUT VICRYL 0 TIES 12 18 (SUTURE) ×6 IMPLANT
SUT VICRYL 0 UR6 27IN ABS (SUTURE) ×6 IMPLANT
SWABSTICK POVIDONE IODINE SNGL (MISCELLANEOUS) ×6 IMPLANT
SYR BULB IRRIGATION 50ML (SYRINGE) ×6 IMPLANT
SYR CONTROL 10ML LL (SYRINGE) IMPLANT
SYRINGE 10CC LL (SYRINGE) ×18 IMPLANT
TOWEL OR 17X24 6PK STRL BLUE (TOWEL DISPOSABLE) ×12 IMPLANT
TOWEL OR 17X26 4PK STRL BLUE (TOWEL DISPOSABLE) ×18 IMPLANT
TRAY DSU PREP LF (CUSTOM PROCEDURE TRAY) ×6 IMPLANT
TRAY FOLEY CATH SILVER 14FR (SET/KITS/TRAYS/PACK) ×6 IMPLANT
TROCAR XCEL NON-BLD 11X100MML (ENDOMECHANICALS) ×6 IMPLANT
TROCAR XCEL NON-BLD 5MMX100MML (ENDOMECHANICALS) ×6 IMPLANT
TUBE CONNECTING 12'X1/4 (SUCTIONS) ×2
TUBE CONNECTING 12X1/4 (SUCTIONS) ×10 IMPLANT
TUBING INSUFFLATION 10FT LAP (TUBING) ×6 IMPLANT
WARMER LAPAROSCOPE (MISCELLANEOUS) ×6 IMPLANT
WATER STERILE IRR 500ML POUR (IV SOLUTION) ×6 IMPLANT
YANKAUER SUCT BULB TIP NO VENT (SUCTIONS) ×6 IMPLANT

## 2015-05-25 NOTE — Transfer of Care (Signed)
Immediate Anesthesia Transfer of Care Note  Patient: Kristina Castro  Procedure(s) Performed: Procedure(s): LAPAROSCOPIC ASSISTED VAGINAL HYSTERECTOMY (N/A) BILATERAL SALPINGECTOMY (Bilateral) NEXPLANON REMOVAL LEFT UPPER ARM (Left)  Patient Location: PACU  Anesthesia Type:General  Level of Consciousness: awake, alert , oriented and patient cooperative  Airway & Oxygen Therapy: Patient Spontanous Breathing and Patient connected to nasal cannula oxygen  Post-op Assessment: Report given to RN and Post -op Vital signs reviewed and stable  Post vital signs: Reviewed and stable  Last Vitals:  Filed Vitals:   05/25/15 0613  BP: 131/67  Pulse: 83  Temp: 36.6 C  Resp: 16    Complications: No apparent anesthesia complications

## 2015-05-25 NOTE — Progress Notes (Signed)
C/o bilateral lower quadrant pain.  Tolerating clears.  No n/v.  No CP/SOB.    VSS. AF.  UOP clear and adequate  Gen: A&O x 3 Abd: soft, ND, inc c/d with bandaids Ext: no c/c/e, SCDs on but not pumping  POD#0 s/p LAVH, bilateral salpingectomy -Will add Toradol -Advance diet -Activate SCDs -AM labs pending -D/C foley and IVF in am -Order heating pad  Linda Hedges, DO

## 2015-05-25 NOTE — Progress Notes (Signed)
No change to H&P.  Rabiah Goeser, DO 

## 2015-05-25 NOTE — Anesthesia Postprocedure Evaluation (Signed)
Anesthesia Post Note  Patient: Kristina Castro  Procedure(s) Performed: Procedure(s) (LRB): LAPAROSCOPIC ASSISTED VAGINAL HYSTERECTOMY (N/A) BILATERAL SALPINGECTOMY (Bilateral) NEXPLANON REMOVAL LEFT UPPER ARM (Left)  Anesthesia type: General  Patient location: PACU  Post pain: Pain level controlled and Adequate analgesia  Post assessment: Post-op Vital signs reviewed, Patient's Cardiovascular Status Stable, Respiratory Function Stable, Patent Airway and Pain level controlled  Last Vitals:  Filed Vitals:   05/25/15 1207  BP: 104/54  Pulse: 82  Temp:   Resp: 11    Post vital signs: Reviewed and stable  Level of consciousness: awake, alert  and oriented  Complications: No apparent anesthesia complications

## 2015-05-25 NOTE — Anesthesia Preprocedure Evaluation (Addendum)
Anesthesia Evaluation  Patient identified by MRN, date of birth, ID band Patient awake    Reviewed: Allergy & Precautions, NPO status , Patient's Chart, lab work & pertinent test results  Airway Mallampati: II   Neck ROM: full    Dental   Pulmonary Current Smoker,  breath sounds clear to auscultation        Cardiovascular Rhythm:regular Rate:Normal  11-May-2013  Normal sinus rhythm   Neuro/Psych    GI/Hepatic Neg liver ROS, GERD-  ,  Endo/Other  negative endocrine ROSGlucose 156. Obese  Renal/GU negative Renal ROS     Musculoskeletal negative musculoskeletal ROS (+)   Abdominal   Peds  Hematology negative hematology ROS (+)   Anesthesia Other Findings   Reproductive/Obstetrics negative OB ROS                           Anesthesia Physical Anesthesia Plan  ASA: II  Anesthesia Plan: General   Post-op Pain Management:    Induction: Intravenous  Airway Management Planned: Oral ETT  Additional Equipment:   Intra-op Plan:   Post-operative Plan: Extubation in OR  Informed Consent: I have reviewed the patients History and Physical, chart, labs and discussed the procedure including the risks, benefits and alternatives for the proposed anesthesia with the patient or authorized representative who has indicated his/her understanding and acceptance.   Dental advisory given  Plan Discussed with: CRNA and Anesthesiologist  Anesthesia Plan Comments:         Anesthesia Quick Evaluation

## 2015-05-25 NOTE — Anesthesia Procedure Notes (Signed)
Procedure Name: Intubation Date/Time: 05/25/2015 7:30 AM Performed by: Wanita Chamberlain Pre-anesthesia Checklist: Patient identified, Timeout performed, Emergency Drugs available, Patient being monitored and Suction available Patient Re-evaluated:Patient Re-evaluated prior to inductionOxygen Delivery Method: Circle system utilized Preoxygenation: Pre-oxygenation with 100% oxygen Intubation Type: IV induction, Rapid sequence and Cricoid Pressure applied Laryngoscope Size: Mac and 3 Grade View: Grade I Tube type: Oral Tube size: 7.0 mm Number of attempts: 1 Airway Equipment and Method: Stylet and Bite block Placement Confirmation: ETT inserted through vocal cords under direct vision,  positive ETCO2 and breath sounds checked- equal and bilateral Secured at: 22 cm Tube secured with: Tape Dental Injury: Teeth and Oropharynx as per pre-operative assessment

## 2015-05-25 NOTE — Op Note (Signed)
PROCEDURE DATE: 1975-11-05 PREOPERATIVE DIAGNOSIS: Heavy menstrual bleeding  POSTOPERATIVE DIAGNOSIS: The same  PROCEDURE: Laparoscopic Assisted Vaginal Hysterectomy, Bilateral salpingectomy, Nexplanon removal SURGEON: Dr. Linda Hedges  ASSISTANT: Dr. Arvella Nigh INDICATIONS: 39 y.o. G7P4 with heavy menstrual bleeding desiring definitive surgical management. Risks of surgery were discussed with the patient including but not limited to: bleeding which may require transfusion or reoperation; infection which may require antibiotics; injury to bowel, bladder, ureters or other surrounding organs; need for additional procedures including laparotomy; thromboembolic phenomenon, incisional problems and other postoperative/anesthesia complications. Written informed consent was obtained.  FINDINGS: Small uterus, normal adnexa bilaterally. No evidence of endometriosis. Normal upper abdomen.  ANESTHESIA: General  ESTIMATED BLOOD LOSS: 775 ml  SPECIMENS: Uterus, cervix and fallopian tubes COMPLICATIONS: None immediate  PROCEDURE IN DETAIL: The patient received intravenous antibiotics and had sequential compression devices applied to her lower extremities while in the preoperative area. She was then taken to the operating room where general anesthesia was administered and was found to be adequate. Prior to tucking the left arm, the Nexplanon site was prepped x 3 with Betadine.  An 11 blade was used to make 4 mm incision at the distal end of the implant.  Nexplanon was removed without difficulty using a hemostat. Pressure dressing was applied.  She was placed in the dorsal lithotomy position, and was prepped and draped in a sterile manner. An in and out catheterization was performed. A uterine manipulator was then advanced into the uterus . After an adequate timeout was performed, attention was then turned to the patient's abdomen where a 10-mm skin incision was made in the umbilical fold. The Veress needle was  carefully introduced into the peritoneal cavity through the abdominal wall. Intraperitoneal placement was confirmed by drop in intraabdominal pressure with insufflation of carbon dioxide gas. Adequate pneumoperitoneum was obtained, and the 10/11 XL trocar and sleeve were then advanced without difficulty into the abdomen where intraabdominal placement was confirmed by the laparoscope. A survey of the patient's pelvis and abdomen revealed entirely normal anatomy.  Suprapubic 5 XL port was then placed under direct visualization. The pelvis was then carefully examined. On the right side, the round ligament was then clamped and transected with the Ligasure. The uteroovarian ligament was also clamped and transected. The leaves of the broad ligament were separated and serially transected. These procedures were then repeated on the left side.  The ureters were noted to be safely away from the area of dissection.  At this point, attention was turned to the vaginal portion of the case. A weighted speculum was placed posteriorly, a Deaver anteriorly, and the cervix grasped with a thyroid tenaculum. Once the anterior and posterior reflections were identified, the cervix was circumscribed using the Bovie knife. Next, using Mayos, the posterior cul-de-sac was entered. The LigaSure was then used to grasp the uterosacrals which were coapted and cut. Next, the bladder reflection was identified. Using Metzenbaums, it was entered and palpation and direct visualization confirmed proper location. Next, using the LigaSure, the uterine arteries were coapted and cut bilaterally. The pedicles were visualized after coaptation and were hemostatic. The same was performed sequentially cephalad. The uterus was avulsed prior to completion of the dissection on the right side.  The bleeding vessels were grasped and suture ligated.  The pedicles were inspected and found to be hemostatic.  Next, the uterosacrals were tagged with monocryl  bilaterally. The posterior peritoneum was closed using monocryl in a purse-string fashion. The uterosacrals were brought together in the midline cuff  closure with a figure-of-8 stitch using monocryl followed by the remainder of the cuff closure in the same fashion. The cuff was inspected and found to be hemostatic.  Foley catheter was placed with clear urine returned.  Attention was returned to the abdomen were a second laparoscopic look was taken. All pedicles were hemostatic.  Insufflation was removed after all instruments were removed.  All skin incisions were closed with 4-0 Vicryl subcuticular stitches and Dermabond. The patient tolerated the procedures well. All instruments, needles, and sponge counts were correct x 2. The patient was taken to the recovery room awake, extubated and in stable condition.

## 2015-05-26 ENCOUNTER — Encounter (HOSPITAL_BASED_OUTPATIENT_CLINIC_OR_DEPARTMENT_OTHER): Payer: Self-pay | Admitting: Obstetrics & Gynecology

## 2015-05-26 DIAGNOSIS — N800 Endometriosis of uterus: Secondary | ICD-10-CM | POA: Diagnosis not present

## 2015-05-26 LAB — COMPREHENSIVE METABOLIC PANEL
ALBUMIN: 3.1 g/dL — AB (ref 3.5–5.0)
ALT: 10 U/L — AB (ref 14–54)
AST: 17 U/L (ref 15–41)
Alkaline Phosphatase: 57 U/L (ref 38–126)
Anion gap: 10 (ref 5–15)
BILIRUBIN TOTAL: 0.4 mg/dL (ref 0.3–1.2)
BUN: 13 mg/dL (ref 6–20)
CHLORIDE: 105 mmol/L (ref 101–111)
CO2: 23 mmol/L (ref 22–32)
CREATININE: 0.86 mg/dL (ref 0.44–1.00)
Calcium: 8.5 mg/dL — ABNORMAL LOW (ref 8.9–10.3)
GFR calc Af Amer: >60 mL/min (ref >60)
GLUCOSE: 137 mg/dL — AB (ref 65–99)
POTASSIUM: 4 mmol/L (ref 3.5–5.1)
Sodium: 138 mmol/L (ref 135–145)
TOTAL PROTEIN: 5.7 g/dL — AB (ref 6.5–8.1)

## 2015-05-26 LAB — CBC
HCT: 25.3 % — ABNORMAL LOW (ref 36.0–46.0)
HEMATOCRIT: 20.7 % — AB (ref 36.0–46.0)
HEMOGLOBIN: 8.3 g/dL — AB (ref 12.0–15.0)
Hemoglobin: 6.7 g/dL — CL (ref 12.0–15.0)
MCH: 25.9 pg — ABNORMAL LOW (ref 26.0–34.0)
MCH: 27.5 pg (ref 26.0–34.0)
MCHC: 32.4 g/dL (ref 30.0–36.0)
MCHC: 32.8 g/dL (ref 30.0–36.0)
MCV: 79.9 fL (ref 78.0–100.0)
MCV: 83.8 fL (ref 78.0–100.0)
Platelets: 258 10*3/uL (ref 150–400)
Platelets: 187 10*3/uL (ref 150–400)
RBC: 2.59 MIL/uL — AB (ref 3.87–5.11)
RBC: 3.02 MIL/uL — AB (ref 3.87–5.11)
RDW: 13.3 % (ref 11.5–15.5)
RDW: 14 % (ref 11.5–15.5)
WBC: 17.8 10*3/uL — AB (ref 4.0–10.5)
WBC: 14.5 10*3/uL — ABNORMAL HIGH (ref 4.0–10.5)

## 2015-05-26 LAB — PREPARE RBC (CROSSMATCH)

## 2015-05-26 MED ORDER — ACETAMINOPHEN 325 MG PO TABS
650.0000 mg | ORAL_TABLET | Freq: Once | ORAL | Status: AC
  Administered 2015-05-26: 650 mg via ORAL
  Filled 2015-05-26: qty 2

## 2015-05-26 MED ORDER — DIPHENHYDRAMINE HCL 25 MG PO CAPS
25.0000 mg | ORAL_CAPSULE | Freq: Once | ORAL | Status: AC
  Administered 2015-05-26: 25 mg via ORAL
  Filled 2015-05-26: qty 1

## 2015-05-26 MED ORDER — SODIUM CHLORIDE 0.9 % IV SOLN
Freq: Once | INTRAVENOUS | Status: AC
  Administered 2015-05-26: 12:00:00 via INTRAVENOUS

## 2015-05-26 NOTE — Progress Notes (Signed)
Pain well controlled.  No V/V.  Up in chair.  Foley still in and IVF still running.  No flatus.  Reports dizziness on standing.    VSS except tachycardia  Hgb 6.7  Gen: A&O x 3 Abd: soft, nondistended.  bandaids c/d x 2 Ext: no c/c/e  POD#1 s/p LAVH -ABL anemia-will tx 2u PRBCs.  Patient is counseled on the risk of viral transmission and transfusion reaction. -D/C foley and IVF -Check post-tx labs -Advance diet -Continue pain control.  Linda Hedges, DO

## 2015-05-26 NOTE — Progress Notes (Signed)
CRITICAL VALUE ALERT  Critical value received:  Hgb 6.7  Date of notification:  05/26/15  Time of notification:  0641  Critical value read back:Yes.    Nurse who received alert:  Cindi Carbon  MD notified (1st page): Dr. Tressia Danas   Time of first page:  0645  MD notified (2nd page):  Time of second page:  Responding MD:   Time MD responded:

## 2015-05-27 DIAGNOSIS — N800 Endometriosis of uterus: Secondary | ICD-10-CM | POA: Diagnosis not present

## 2015-05-27 LAB — TYPE AND SCREEN
ABO/RH(D): O POS
Antibody Screen: NEGATIVE
Unit division: 0
Unit division: 0

## 2015-05-27 LAB — HEMOGLOBIN A1C
HEMOGLOBIN A1C: 5.4 % (ref 4.8–5.6)
Mean Plasma Glucose: 108 mg/dL

## 2015-05-27 LAB — CBC
HEMATOCRIT: 26.6 % — AB (ref 36.0–46.0)
HEMOGLOBIN: 9 g/dL — AB (ref 12.0–15.0)
MCH: 28.4 pg (ref 26.0–34.0)
MCHC: 33.8 g/dL (ref 30.0–36.0)
MCV: 83.9 fL (ref 78.0–100.0)
Platelets: 208 10*3/uL (ref 150–400)
RBC: 3.17 MIL/uL — AB (ref 3.87–5.11)
RDW: 14 % (ref 11.5–15.5)
WBC: 12.9 10*3/uL — ABNORMAL HIGH (ref 4.0–10.5)

## 2015-05-27 MED ORDER — IBUPROFEN 800 MG PO TABS: 800.0000 mg | ORAL_TABLET | Freq: Four times a day (QID) | ORAL | Status: DC | PRN

## 2015-05-27 MED ORDER — OXYCODONE-ACETAMINOPHEN 5-325 MG PO TABS: 1.0000 | ORAL_TABLET | ORAL | Status: DC | PRN

## 2015-05-27 MED ORDER — FERROUS SULFATE 325 (65 FE) MG PO TABS: 325.0000 mg | ORAL_TABLET | Freq: Two times a day (BID) | ORAL | Status: DC

## 2015-05-27 MED ORDER — DOCUSATE SODIUM 100 MG PO CAPS: 100.0000 mg | ORAL_CAPSULE | Freq: Two times a day (BID) | ORAL | Status: DC

## 2015-05-27 NOTE — Discharge Instructions (Signed)
Call MD for T>100.4, heavy vaginal bleeding, severe abdominal pain, intractable nausea and/or vomiting, or respiratory distress.  Call office to schedule postop appointment in 2 weeks.  No driving while taking narcotics.  No heavy lifting.  Pelvic rest x 6 weeks.  No tub baths; shower only.

## 2015-05-27 NOTE — Progress Notes (Signed)
2 Days Post-Op Procedure(s) (LRB): LAPAROSCOPIC ASSISTED VAGINAL HYSTERECTOMY (N/A) BILATERAL SALPINGECTOMY (Bilateral) NEXPLANON REMOVAL LEFT UPPER ARM (Left)  Subjective: Patient reports tolerating PO, + flatus and no problems voiding.    Objective: I have reviewed patient's vital signs, intake and output, medications and labs.  General: alert, cooperative and appears stated age GI: normal findings: soft, ND and incision: clean and dry Extremities: extremities normal, atraumatic, no cyanosis or edema  Assessment: s/p Procedure(s): LAPAROSCOPIC ASSISTED VAGINAL HYSTERECTOMY (N/A) BILATERAL SALPINGECTOMY (Bilateral) NEXPLANON REMOVAL LEFT UPPER ARM (Left): stable  Plan: Discharge home     [place], [facility] 05/27/2015, 9:46 AM

## 2015-05-27 NOTE — Discharge Summary (Signed)
Physician Discharge Summary  Patient ID: Kristina Castro MRN: [id number] DOB/AGE: [birth year] 39 y.o.  Admit date: 05/25/2015 Discharge date: 05/27/2015  Admission Diagnoses:  Heavy menstrual bleeding  Discharge Diagnoses:  SAA Active Problems:   S/P hysterectomy   Discharged Condition: good  Hospital Course: Patient was admitted for surgical management of heavy menstrual bleeding.  She underwent the anticipated procedure: LAVH, bilateral salpingectomy, Nexplanon removal.  Intraoperative course was complicated by 326ZT EBL.  On POD#0, the patient had IVF bolus and maintained adequate urine output and stable vital signs.  On POD#1, the patient reports dizziness on standing and was given 2 units PRBCs.  By POD#2, the patient was meeting all goals and feeling better.  She was discharged home with office follow up.  Consults: None  Significant Diagnostic Studies: labs: 9.0  Treatments: procedures: transfusion of 2u PRBCs and surgery: LAVH, b/l salpingectomy, nexplanon removal  Discharge Exam: Blood pressure 134/68, pulse 89, temperature 98.6 F (37 C), temperature source Oral, resp. rate 20, height 5\' 5"  (1.651 m), weight 201 lb 8 oz (91.4 kg), last menstrual period 05/14/2015, SpO2 100 %. General appearance: alert, cooperative and appears stated age GI: normal findings: soft, ND Extremities: extremities normal, atraumatic, no cyanosis or edema Incision/Wound: c/d with bandaids  Disposition: 01-Home or Self Care  Discharge Instructions    Discharge patient    Complete by:  As directed             Medication List    STOP taking these medications        ALEVE 220 MG Caps  Generic drug:  Naproxen Sodium     omeprazole 20 MG capsule  Commonly known as:  PRILOSEC      TAKE these medications        acyclovir 800 MG tablet  Commonly known as:  ZOVIRAX  Take 800 mg by mouth daily.     docusate sodium 100 MG capsule  Commonly known as:  COLACE  Take 1 capsule (100 mg total)  by mouth 2 (two) times daily.     ferrous sulfate 325 (65 FE) MG tablet  Take 1 tablet (325 mg total) by mouth 2 (two) times daily with a meal.     ibuprofen 800 MG tablet  Commonly known as:  ADVIL  Take 1 tablet (800 mg total) by mouth every 6 (six) hours as needed.     MIRALAX PO  Take 17 g by mouth daily as needed (constipation).     oxyCODONE-acetaminophen 5-325 MG per tablet  Commonly known as:  PERCOCET/ROXICET  Take 1-2 tablets by mouth every 4 (four) hours as needed (moderate to severe pain (when tolerating fluids)).         SignedLinda Hedges 05/27/2015, 9:38 AM

## 2015-05-27 NOTE — Progress Notes (Signed)
Pt discharged home.  VSS. IV d/c with catheter intact. Discharge instructions with prescription given. Pt verbalized understanding. Pt escorted to main entrance via WC to awaiting private vehicle.

## 2015-06-13 ENCOUNTER — Inpatient Hospital Stay (HOSPITAL_COMMUNITY)
Admission: EM | Admit: 2015-06-13 | Discharge: 2015-06-17 | DRG: 863 | Disposition: A | Discharge status: Home / Self Care | Payer: Commercial Managed Care - PPO | Attending: Obstetrics and Gynecology | Admitting: Obstetrics and Gynecology

## 2015-06-13 ENCOUNTER — Encounter (HOSPITAL_COMMUNITY): Payer: Self-pay | Admitting: Emergency Medicine

## 2015-06-13 ENCOUNTER — Emergency Department (HOSPITAL_COMMUNITY): Payer: Commercial Managed Care - PPO

## 2015-06-13 DIAGNOSIS — R42 Dizziness and giddiness: Secondary | ICD-10-CM | POA: Diagnosis present

## 2015-06-13 DIAGNOSIS — Z87898 Personal history of other specified conditions: Secondary | ICD-10-CM

## 2015-06-13 DIAGNOSIS — Z79891 Long term (current) use of opiate analgesic: Secondary | ICD-10-CM | POA: Diagnosis not present

## 2015-06-13 DIAGNOSIS — M549 Dorsalgia, unspecified: Secondary | ICD-10-CM | POA: Diagnosis present

## 2015-06-13 DIAGNOSIS — F17210 Nicotine dependence, cigarettes, uncomplicated: Secondary | ICD-10-CM | POA: Diagnosis present

## 2015-06-13 DIAGNOSIS — Z91048 Other nonmedicinal substance allergy status: Secondary | ICD-10-CM

## 2015-06-13 DIAGNOSIS — R112 Nausea with vomiting, unspecified: Secondary | ICD-10-CM | POA: Diagnosis present

## 2015-06-13 DIAGNOSIS — N99821 Postprocedural hemorrhage and hematoma of a genitourinary system organ or structure following other procedure: Secondary | ICD-10-CM | POA: Diagnosis present

## 2015-06-13 DIAGNOSIS — Y838 Other surgical procedures as the cause of abnormal reaction of the patient, or of later complication, without mention of misadventure at the time of the procedure: Secondary | ICD-10-CM | POA: Diagnosis present

## 2015-06-13 DIAGNOSIS — R1900 Intra-abdominal and pelvic swelling, mass and lump, unspecified site: Secondary | ICD-10-CM | POA: Diagnosis present

## 2015-06-13 DIAGNOSIS — Z79899 Other long term (current) drug therapy: Secondary | ICD-10-CM | POA: Diagnosis not present

## 2015-06-13 DIAGNOSIS — T814XXA Infection following a procedure, initial encounter: Secondary | ICD-10-CM | POA: Diagnosis present

## 2015-06-13 DIAGNOSIS — N9489 Other specified conditions associated with female genital organs and menstrual cycle: Secondary | ICD-10-CM | POA: Diagnosis present

## 2015-06-13 DIAGNOSIS — B009 Herpesviral infection, unspecified: Secondary | ICD-10-CM | POA: Diagnosis present

## 2015-06-13 DIAGNOSIS — Y929 Unspecified place or not applicable: Secondary | ICD-10-CM

## 2015-06-13 DIAGNOSIS — N739 Female pelvic inflammatory disease, unspecified: Secondary | ICD-10-CM

## 2015-06-13 DIAGNOSIS — K219 Gastro-esophageal reflux disease without esophagitis: Secondary | ICD-10-CM | POA: Diagnosis present

## 2015-06-13 DIAGNOSIS — Z90710 Acquired absence of both cervix and uterus: Secondary | ICD-10-CM

## 2015-06-13 LAB — COMPREHENSIVE METABOLIC PANEL
ALBUMIN: 3.2 g/dL — AB (ref 3.5–5.0)
ALT: 13 U/L — ABNORMAL LOW (ref 14–54)
AST: 13 U/L — AB (ref 15–41)
Alkaline Phosphatase: 92 U/L (ref 38–126)
Anion gap: 9 (ref 5–15)
BUN: 6 mg/dL (ref 6–20)
CHLORIDE: 103 mmol/L (ref 101–111)
CO2: 28 mmol/L (ref 22–32)
Calcium: 8.5 mg/dL — ABNORMAL LOW (ref 8.9–10.3)
Creatinine, Ser: 0.7 mg/dL (ref 0.44–1.00)
GFR calc Af Amer: >60 mL/min (ref >60)
GFR calc non Af Amer: >60 mL/min (ref >60)
GLUCOSE: 91 mg/dL (ref 65–99)
POTASSIUM: 3.1 mmol/L — AB (ref 3.5–5.1)
Sodium: 140 mmol/L (ref 135–145)
Total Bilirubin: 0.9 mg/dL (ref 0.3–1.2)
Total Protein: 7.2 g/dL (ref 6.5–8.1)

## 2015-06-13 LAB — CBC WITH DIFFERENTIAL/PLATELET
Basophils Absolute: 0 10*3/uL (ref 0.0–0.1)
Basophils Relative: 0 %
Eosinophils Absolute: 0.1 10*3/uL (ref 0.0–0.7)
Eosinophils Relative: 1 %
HEMATOCRIT: 30 % — AB (ref 36.0–46.0)
Hemoglobin: 9.9 g/dL — ABNORMAL LOW (ref 12.0–15.0)
LYMPHS ABS: 2.3 10*3/uL (ref 0.7–4.0)
LYMPHS PCT: 17 %
MCH: 27 pg (ref 26.0–34.0)
MCHC: 33 g/dL (ref 30.0–36.0)
MCV: 82 fL (ref 78.0–100.0)
MONO ABS: 0.9 10*3/uL (ref 0.1–1.0)
MONOS PCT: 7 %
NEUTROS ABS: 9.9 10*3/uL — AB (ref 1.7–7.7)
Neutrophils Relative %: 75 %
Platelets: 346 10*3/uL (ref 150–400)
RBC: 3.66 MIL/uL — ABNORMAL LOW (ref 3.87–5.11)
RDW: 13.4 % (ref 11.5–15.5)
WBC: 13.1 10*3/uL — ABNORMAL HIGH (ref 4.0–10.5)

## 2015-06-13 LAB — LIPASE, BLOOD: Lipase: 21 U/L — ABNORMAL LOW (ref 22–51)

## 2015-06-13 LAB — URINALYSIS, ROUTINE W REFLEX MICROSCOPIC
Glucose, UA: NEGATIVE mg/dL
Ketones, ur: NEGATIVE mg/dL
NITRITE: NEGATIVE
PH: 7.5 (ref 5.0–8.0)
Protein, ur: 30 mg/dL — AB
SPECIFIC GRAVITY, URINE: 1.018 (ref 1.005–1.030)
Urobilinogen, UA: >8 mg/dL — ABNORMAL HIGH (ref 0.0–1.0)

## 2015-06-13 LAB — URINE MICROSCOPIC-ADD ON

## 2015-06-13 MED ORDER — ONDANSETRON HCL 4 MG PO TABS: 4.0000 mg | ORAL_TABLET | Freq: Four times a day (QID) | ORAL | Status: DC | PRN

## 2015-06-13 MED ORDER — OXYCODONE-ACETAMINOPHEN 5-325 MG PO TABS
ORAL_TABLET | ORAL | Status: AC
  Administered 2015-06-14: 2 via ORAL
  Filled 2015-06-13: qty 2

## 2015-06-13 MED ORDER — ACETAMINOPHEN 325 MG PO TABS: 650.0000 mg | ORAL_TABLET | ORAL | Status: DC | PRN

## 2015-06-13 MED ORDER — POTASSIUM CHLORIDE CRYS ER 20 MEQ PO TBCR
20.0000 meq | EXTENDED_RELEASE_TABLET | Freq: Once | ORAL | Status: AC
  Administered 2015-06-13: 20 meq via ORAL
  Filled 2015-06-13: qty 1

## 2015-06-13 MED ORDER — AMPICILLIN-SULBACTAM SODIUM 3 (2-1) G IJ SOLR
3.0000 g | Freq: Four times a day (QID) | INTRAMUSCULAR | Status: DC
  Administered 2015-06-13 – 2015-06-15 (×6): 3 g via INTRAVENOUS
  Filled 2015-06-13 (×8): qty 3

## 2015-06-13 MED ORDER — LACTATED RINGERS IV SOLN
INTRAVENOUS | Status: DC
Start: 2015-06-13 — End: 2015-06-18
  Administered 2015-06-13 – 2015-06-14 (×2): via INTRAVENOUS

## 2015-06-13 MED ORDER — ALUM & MAG HYDROXIDE-SIMETH 200-200-20 MG/5ML PO SUSP: 30.0000 mL | ORAL | Status: DC | PRN

## 2015-06-13 MED ORDER — ONDANSETRON HCL 4 MG/2ML IJ SOLN: 4.0000 mg | Freq: Four times a day (QID) | INTRAMUSCULAR | Status: DC | PRN

## 2015-06-13 MED ORDER — ZOLPIDEM TARTRATE 5 MG PO TABS: 5.0000 mg | ORAL_TABLET | Freq: Every evening | ORAL | Status: DC | PRN

## 2015-06-13 MED ORDER — IOHEXOL 300 MG/ML  SOLN
100.0000 mL | Freq: Once | INTRAMUSCULAR | Status: AC | PRN
  Administered 2015-06-13: 100 mL via INTRAVENOUS

## 2015-06-13 MED ORDER — OXYCODONE-ACETAMINOPHEN 5-325 MG PO TABS
1.0000 | ORAL_TABLET | ORAL | Status: DC | PRN
  Administered 2015-06-13 – 2015-06-14 (×5): 2 via ORAL
  Administered 2015-06-15 (×4): 1 via ORAL
  Administered 2015-06-15: 2 via ORAL
  Administered 2015-06-16 (×4): 1 via ORAL
  Administered 2015-06-16 – 2015-06-17 (×2): 2 via ORAL
  Administered 2015-06-17: 1 via ORAL
  Administered 2015-06-17: 2 via ORAL
  Filled 2015-06-13 (×2): qty 2
  Filled 2015-06-13: qty 1
  Filled 2015-06-13: qty 2
  Filled 2015-06-13 (×6): qty 1
  Filled 2015-06-13 (×2): qty 2
  Filled 2015-06-13: qty 1
  Filled 2015-06-13 (×3): qty 2
  Filled 2015-06-13: qty 1

## 2015-06-13 MED ORDER — KETOROLAC TROMETHAMINE 30 MG/ML IJ SOLN
30.0000 mg | Freq: Once | INTRAMUSCULAR | Status: AC
Start: 2015-06-13 — End: 2015-06-13
  Administered 2015-06-13: 30 mg via INTRAVENOUS
  Filled 2015-06-13: qty 1

## 2015-06-13 MED ORDER — IOHEXOL 300 MG/ML  SOLN
25.0000 mL | Freq: Once | INTRAMUSCULAR | Status: AC | PRN
  Administered 2015-06-13: 25 mL via ORAL

## 2015-06-13 MED ORDER — MORPHINE SULFATE (PF) 4 MG/ML IV SOLN
4.0000 mg | Freq: Once | INTRAVENOUS | Status: AC
  Administered 2015-06-13: 4 mg via INTRAVENOUS
  Filled 2015-06-13: qty 1

## 2015-06-13 MED ORDER — ONDANSETRON HCL 4 MG/2ML IJ SOLN
4.0000 mg | Freq: Once | INTRAMUSCULAR | Status: AC
  Administered 2015-06-13: 4 mg via INTRAVENOUS
  Filled 2015-06-13: qty 2

## 2015-06-13 MED ORDER — SODIUM CHLORIDE 0.9 % IV SOLN
1000.0000 mL | Freq: Once | INTRAVENOUS | Status: AC
  Administered 2015-06-13: 1000 mL via INTRAVENOUS

## 2015-06-13 MED ORDER — SODIUM CHLORIDE 0.9 % IV SOLN
1000.0000 mL | INTRAVENOUS | Status: DC
  Administered 2015-06-13: 1000 mL via INTRAVENOUS

## 2015-06-13 NOTE — ED Provider Notes (Signed)
4:40 PM: pt hand off from Harlene Ramus, PA-C Vaginal bleeding -Consult gyn, coming to see her.  Labs-WBC 13. CT abdomen showed abscess s/p hysterectomy. Pain controlled morphine.  Nausea controlled with zofran.  Admit to gynecology vs hospitalist.  Gyn to see her.  Not abx started yet per gyn recommendation.  Hemodynamically stable.   -Dr. Gaetano Net will admit for pelvic abscess.  Started on Unasyn, IVF.  Will have CT guided drain in AM.  Gloriann Loan, PA-C 06/13/15 [place], MD 06/13/15 2138

## 2015-06-13 NOTE — ED Notes (Signed)
[facility] was called and said it is a few people in front of her. It will be a few hours before someone can come

## 2015-06-13 NOTE — ED Provider Notes (Signed)
CSN: [id number]     Arrival date & time 06/13/15  1114 History   First MD Initiated Contact with Patient 06/13/15 1158     Chief Complaint  Patient presents with  . Vaginal Bleeding  . Emesis     (Consider location/radiation/quality/duration/timing/severity/associated sxs/prior Treatment) HPI Comments: Pt is a 39 yo female with PMH of hysterectomy (05/25/15), small bowel obstruction, GERD and gastritis who presents to the ED with complaint of vaginal bleeding, onset 3 days. Pt reports she started having lower abdominal cramping with a small amount of vaginal bleeding Friday. She notes on Saturday she had worsening bleeding, pain and associated fever, N/V, dizziness (occurs while supine), dysuria, hot flashes, myalgias. Denies headache, visual changes, sore throat, SOB, CP, diarrhea, constipation, blood in urine or stool, numbness, tingling, hemoptysis, syncope. Pt reports she has taken ibuprofen and percocet at home without relief. She notes she recently saw her OBGYN last Tuesday and reports she was feeling fine at her appointment.    Past Medical History  Diagnosis Date  . HSV-2 (herpes simplex virus 2) infection 2007    currently taking meds.  . History of gastritis     1998  . History of abnormal cervical Pap smear   . History of gestational diabetes   . History of small bowel obstruction     05-11-2013 resolved without surgical intervention  . Heavy menstrual bleeding   . GERD (gastroesophageal reflux disease)   . History of cardiac murmur     only during one pregancy--    Past Surgical History  Procedure Laterality Date  . Dilation and evacuation  x3   last one 2011  . Laparoscopic assisted vaginal hysterectomy N/A 05/25/2015    Procedure: LAPAROSCOPIC ASSISTED VAGINAL HYSTERECTOMY;  Surgeon: Linda Hedges, DO;  Location: [place];  Service: Gynecology;  Laterality: N/A;  . Bilateral salpingectomy Bilateral 05/25/2015    Procedure: BILATERAL SALPINGECTOMY;   Surgeon: Linda Hedges, DO;  Location: [place];  Service: Gynecology;  Laterality: Bilateral;  . Iud removal Left 05/25/2015    Procedure: NEXPLANON REMOVAL LEFT UPPER ARM;  Surgeon: Linda Hedges, DO;  Location: [place];  Service: Gynecology;  Laterality: Left;   Family History  Problem Relation Age of Onset  . Anesthesia problems Neg Hx   . Cancer Mother     breast  . Diabetes Mother   . Hypertension Father   . Hypertension Paternal Aunt   . Stroke Maternal Grandmother   . Diabetes Maternal Grandmother    Social History  Substance Use Topics  . Smoking status: Current Some Day Smoker -- 0.00 packs/day for 5 years    Types: Cigars  . Smokeless tobacco: Never Used     Comment: hx smoking on and off--  currently cigar's 1pp2wk occasional  . Alcohol Use: 4.2 oz/week    0 Standard drinks or equivalent, 7 Glasses of wine per week     Comment: daily wine   OB History    Gravida Para Term Preterm AB TAB SAB Ectopic Multiple Living   7 4 4  0 3 3 0 0 0 4     Review of Systems  Gastrointestinal: Positive for nausea, vomiting and abdominal pain.  Genitourinary: Positive for dysuria, flank pain and vaginal bleeding.  Musculoskeletal: Positive for myalgias.  Neurological: Positive for dizziness.      Allergies  Adhesive  Home Medications   Prior to Admission medications   Medication Sig Start Date End Date Taking? Authorizing Provider  acyclovir (ZOVIRAX) 800 MG tablet Take 800 mg by mouth daily.     Yes Historical Provider, MD  docusate sodium (COLACE) 100 MG capsule Take 1 capsule (100 mg total) by mouth 2 (two) times daily. 05/27/15  Yes Megan Morris, DO  ferrous sulfate 325 (65 FE) MG tablet Take 1 tablet (325 mg total) by mouth 2 (two) times daily with a meal. 05/27/15  Yes Megan Morris, DO  ibuprofen (ADVIL,MOTRIN) 800 MG tablet Take 1 tablet (800 mg total) by mouth every 6 (six) hours as needed. 05/27/15  Yes Megan Morris, DO   oxyCODONE-acetaminophen (PERCOCET/ROXICET) 5-325 MG per tablet Take 1-2 tablets by mouth every 4 (four) hours as needed (moderate to severe pain (when tolerating fluids)). 05/27/15  Yes Megan Morris, DO   BP 122/64 mmHg  Pulse 116  Temp(Src) 98.9 F (37.2 C) (Oral)  Resp 18  SpO2 98% Physical Exam  Constitutional: She is oriented to person, place, and time. She appears well-developed and well-nourished. No distress.  HENT:  Head: Normocephalic and atraumatic.  Mouth/Throat: Oropharynx is clear and moist. No oropharyngeal exudate.  Eyes: Conjunctivae and EOM are normal. Pupils are equal, round, and reactive to light. Right eye exhibits no discharge. Left eye exhibits no discharge. No scleral icterus.  Neck: Normal range of motion. Neck supple.  Cardiovascular: Regular rhythm, normal heart sounds and intact distal pulses.   tachycardic  Pulmonary/Chest: Effort normal and breath sounds normal. No respiratory distress. She has no wheezes. She has no rales. She exhibits no tenderness.  Abdominal: Soft. Bowel sounds are normal. She exhibits no distension and no mass. There is tenderness (LUQ, LLQ, suprapubic regions). There is no rebound and no guarding.  Musculoskeletal: Normal range of motion. She exhibits no edema.  Lymphadenopathy:    She has no cervical adenopathy.  Neurological: She is alert and oriented to person, place, and time.  Skin: Skin is warm and dry. She is not diaphoretic.  Nursing note and vitals reviewed.   ED Course  Procedures (including critical care time) Labs Review Labs Reviewed  COMPREHENSIVE METABOLIC PANEL  LIPASE, BLOOD  CBC WITH DIFFERENTIAL/PLATELET  URINALYSIS, ROUTINE W REFLEX MICROSCOPIC (NOT AT [facility])    Imaging Review No results found. I have personally reviewed and evaluated these images and lab results as part of my medical decision-making.  Pelvic exam: normal external genitalia, VULVA: normal appearing vulva with no masses, tenderness or  lesions, VAGINA: normal appearing vagina with normal color and discharge, no lesions, small amount of blood tinged mucus in vaginal vault, CERVIX: surgically absent, UTERUS: surgically absent, vaginal cuff well healed, ADNEXA: surgically absent bilateral, exam chaperoned by female nurse tech.    MDM   Final diagnoses:  Pelvic abscess in female  S/P hysterectomy    Pt presents to the ED with complaint of vaginal bleeding with associated fever, N/V, dysuria, myalgias and abdominal pain. Hysterectomy done on 05/25/15. Afebrile in ED, tachycardic. TTP in LUQ, LLQ and suprapubic region. Pelvic exam revealed small amount of blood-tinged mucus. Pt given antiemetics and pain meds. WBC 13.1. CT abdomen revealed pelvic abscess in resection bed at site of recent hysterectomy. Pt reports pain and nausea have improved.   Consulted OBGYN on call for [facility], Dr. Gaetano Net states he will come see the pt in the ED.   Waiting for GYN to evaluate pt in ED, hand-off to [facility], PA-C.  Chesley Noon [place], [place] 06/13/15 1652  Alfonzo Beers, MD 06/18/15 [phone]

## 2015-06-13 NOTE — H&P (Signed)
Kristina Castro is an 39 y.o. female S/P LAVH/bilateral salpingectomy with Dr Lynnette Caffey 05/25/15. Received 2 [place] PRBCs post op. Sent home feeling OK. Was doing better until 2 days ago when she developed mild lower abdominal discomfort. This was followed by sharp central pelvic menstrual type cramps. Finally had some BRB per vagina today-less than a normal menstrual flow. She was nauseous in ED, relieved with Zofran. Bowels working with loose stools and no pain with urination until after exam done by PA in ED.  Pertinent Gynecological History: Menses: N/A Bleeding: N/A Contraception: none DES exposure: denies Blood transfusions: see above Sexually transmitted diseases: no past history Previous GYN Procedures: see above  Last mammogram: normal Date: 2016 Last pap: unknown Date: N/A OB History: G7, P4   Menstrual History: Menarche age: unknown  Patient's last menstrual period was 05/25/2015.    Past Medical History  Diagnosis Date  . HSV-2 (herpes simplex virus 2) infection 2007    currently taking meds.  . History of gastritis     1998  . History of abnormal cervical Pap smear   . History of gestational diabetes   . History of small bowel obstruction     05-11-2013 resolved without surgical intervention  . Heavy menstrual bleeding   . GERD (gastroesophageal reflux disease)   . History of cardiac murmur     only during one pregancy--     Past Surgical History  Procedure Laterality Date  . Dilation and evacuation  x3   last one 2011  . Laparoscopic assisted vaginal hysterectomy N/A 05/25/2015    Procedure: LAPAROSCOPIC ASSISTED VAGINAL HYSTERECTOMY;  Surgeon: Linda Hedges, DO;  Location: [facility] [place];  Service: Gynecology;  Laterality: N/A;  . Bilateral salpingectomy Bilateral 05/25/2015    Procedure: BILATERAL SALPINGECTOMY;  Surgeon: Linda Hedges, DO;  Location: [place];  Service: Gynecology;  Laterality: Bilateral;  . Iud removal Left 05/25/2015     Procedure: NEXPLANON REMOVAL LEFT UPPER ARM;  Surgeon: Linda Hedges, DO;  Location: [facility];  Service: Gynecology;  Laterality: Left;    Family History  Problem Relation Age of Onset  . Anesthesia problems Neg Hx   . Cancer Mother     breast  . Diabetes Mother   . Hypertension Father   . Hypertension Paternal Aunt   . Stroke Maternal Grandmother   . Diabetes Maternal Grandmother     Social History:  reports that she has been smoking Cigars.  She has never used smokeless tobacco. She reports that she drinks about 4.2 oz of alcohol per week. She reports that she does not use illicit drugs.  Allergies:  Allergies  Allergen Reactions  . Adhesive [Tape] Rash    All tapes including paper tape     (Not in a hospital admission)  Review of Systems  Constitutional: Positive for fever and chills.       Chills at home Reports temp of 101.4 at home this am  Respiratory: Negative for cough and shortness of breath.   Cardiovascular: Negative for chest pain.  Gastrointestinal: Positive for nausea. Negative for vomiting and constipation.       Nausea in ED controlled with Zofran  Neurological: Positive for dizziness. Negative for headaches.       Mild dizziness off/on at home    Blood pressure 122/64, pulse 116, temperature 98.9 F (37.2 C), temperature source Oral, resp. rate 18, last menstrual period 05/25/2015, SpO2 98 %. Physical Exam  Constitutional: She is oriented to person,  place, and time.  Cardiovascular: Normal rate and regular rhythm.   Respiratory: Effort normal and breath sounds normal.  GI: Soft. There is tenderness.  Abdomen soft BS + x4, a little sluggish Mild tenderness in bilateral lower quadrants without rebound or palpable mass  Neurological: She is alert and oriented to person, place, and time.  Smiling in NAD  Skin: Skin is warm and dry.    Results for orders placed or performed during the hospital encounter of 06/13/15 (from the past 24  hour(s))  Comprehensive metabolic panel     Status: Abnormal   Collection Time: 06/13/15  1:03 PM  Result Value Ref Range   Sodium 140 135 - 145 mmol/L   Potassium 3.1 (L) 3.5 - 5.1 mmol/L   Chloride 103 101 - 111 mmol/L   CO2 28 22 - 32 mmol/L   Glucose, Bld 91 65 - 99 mg/dL   BUN 6 6 - 20 mg/dL   Creatinine, Ser 0.70 0.44 - 1.00 mg/dL   Calcium 8.5 (L) 8.9 - 10.3 mg/dL   Total Protein 7.2 6.5 - 8.1 g/dL   Albumin 3.2 (L) 3.5 - 5.0 g/dL   AST 13 (L) 15 - 41 U/L   ALT 13 (L) 14 - 54 U/L   Alkaline Phosphatase 92 38 - 126 U/L   Total Bilirubin 0.9 0.3 - 1.2 mg/dL   GFR calc non Af Amer >60 >60 mL/min   GFR calc Af Amer >60 >60 mL/min   Anion gap 9 5 - 15  Lipase, blood     Status: Abnormal   Collection Time: 06/13/15  1:03 PM  Result Value Ref Range   Lipase 21 (L) 22 - 51 U/L  CBC with Differential     Status: Abnormal   Collection Time: 06/13/15  1:03 PM  Result Value Ref Range   WBC 13.1 (H) 4.0 - 10.5 K/uL   RBC 3.66 (L) 3.87 - 5.11 MIL/uL   Hemoglobin 9.9 (L) 12.0 - 15.0 g/dL   HCT 30.0 (L) 36.0 - 46.0 %   MCV 82.0 78.0 - 100.0 fL   MCH 27.0 26.0 - 34.0 pg   MCHC 33.0 30.0 - 36.0 g/dL   RDW 13.4 11.5 - 15.5 %   Platelets 346 150 - 400 K/uL   Neutrophils Relative % 75 %   Neutro Abs 9.9 (H) 1.7 - 7.7 K/uL   Lymphocytes Relative 17 %   Lymphs Abs 2.3 0.7 - 4.0 K/uL   Monocytes Relative 7 %   Monocytes Absolute 0.9 0.1 - 1.0 K/uL   Eosinophils Relative 1 %   Eosinophils Absolute 0.1 0.0 - 0.7 K/uL   Basophils Relative 0 %   Basophils Absolute 0.0 0.0 - 0.1 K/uL  Urinalysis, Routine w reflex microscopic     Status: Abnormal   Collection Time: 06/13/15  1:37 PM  Result Value Ref Range   Color, Urine AMBER (A) YELLOW   APPearance CLEAR CLEAR   Specific Gravity, Urine 1.018 1.005 - 1.030   pH 7.5 5.0 - 8.0   Glucose, UA NEGATIVE NEGATIVE mg/dL   Hgb urine dipstick MODERATE (A) NEGATIVE   Bilirubin Urine SMALL (A) NEGATIVE   Ketones, ur NEGATIVE NEGATIVE mg/dL    Protein, ur 30 (A) NEGATIVE mg/dL   Urobilinogen, UA >8.0 (H) 0.0 - 1.0 mg/dL   Nitrite NEGATIVE NEGATIVE   Leukocytes, UA SMALL (A) NEGATIVE  Urine microscopic-add on     Status: None   Collection Time: 06/13/15  1:37 PM  Result Value Ref Range   Squamous Epithelial / LPF RARE RARE   WBC, UA 0-2 <3 WBC/hpf   RBC / HPF 7-10 <3 RBC/hpf   Urine-Other MUCOUS PRESENT     Ct Abdomen Pelvis W Contrast  06/13/2015   CLINICAL DATA:  39 year old female status post hysterectomy on May 25, 2015. Fever and lower abdominal cramping for the past 2 days. Some associated dizziness and 3 episodes of emesis. Vaginal bleeding today. Left lower quadrant abdominal pain.  EXAM: CT ABDOMEN AND PELVIS WITH CONTRAST  TECHNIQUE: Multidetector CT imaging of the abdomen and pelvis was performed using the standard protocol following bolus administration of intravenous contrast.  CONTRAST:  129mL OMNIPAQUE IOHEXOL 300 MG/ML SOLN, 78mL OMNIPAQUE IOHEXOL 300 MG/ML SOLN  COMPARISON:  CT the abdomen and pelvis 05/11/2013.  FINDINGS: Lower chest:  Unremarkable.  Hepatobiliary: No suspicious cystic or solid hepatic lesions. No intra or extrahepatic biliary ductal dilatation. Gallbladder is unremarkable in appearance.  Pancreas: No pancreatic mass. No pancreatic ductal dilatation. No pancreatic or peripancreatic fluid or inflammatory changes.  Spleen: Unremarkable.  Adrenals/Urinary Tract: Normal appearance of the kidneys and adrenal glands bilaterally. No hydroureteronephrosis or perinephric stranding to indicate urinary tract obstruction at this time. Urinary bladder is normal in appearance.  Stomach/Bowel: Normal appearance of the stomach. No pathologic dilatation of small bowel or colon. Normal appendix.  Vascular/Lymphatic: No significant atherosclerotic calcifications or definite aneurysm identified in the abdominal or pelvic vasculature. Numerous prominent borderline enlarged retroperitoneal and mesenteric lymph nodes are  noted, likely reactive.  Reproductive: Status post hysterectomy. In the resection bed there is a large 11.3 x 8.8 x 7.7 cm centrally low to intermediate attenuation fluid collection with thick rim of enhancement and some internal loculations, compatible with a large abscess. Surrounding inflammatory changes throughout the adjacent pelvis. Ovaries are not clearly identified on today's examination and may be surgically absent.  Other: Trace volume of ascites.  No pneumoperitoneum.  Musculoskeletal: There are no aggressive appearing lytic or blastic lesions noted in the visualized portions of the skeleton.  IMPRESSION: 1. Large complex pelvic abscess in the resection bed at site of recent hysterectomy measuring 13.3 x 8.8 x 7.7 cm, as above. 2. Additional incidental findings, as above. These results will be called to the ordering clinician or representative by the Radiologist Assistant, and communication documented in the PACS or zVision Dashboard.   Electronically Signed   By: Vinnie Langton M.D.   On: 06/13/2015 15:01    Assessment/Plan: 39 yo S/P LAVH/BS 19 days ago with pelvic fluid collection-abcess vs hematoma Admit to hospital-begin Unasyn, recheck labs in am, clear liquids, NPO after midnight, IV fluids CT guided drain placement ordered for am  Anissia Wessells II,Kelliann Pendergraph E 06/13/2015, 5:42 PM

## 2015-06-13 NOTE — ED Notes (Signed)
[facility] called for transport to [facility] hospital.  Spoke with [employer].

## 2015-06-13 NOTE — ED Notes (Signed)
PT had a hysterectomy on Sept 6. No complications with recovery until Friday when she began to have a fever of 101.46F and lower abd cramping. Pt has been taking ibuprofen every 6 hours for fever and pain. Yesterday she had some dizziness and 3 episodes of emesis. Today pt began to have vaginal bleeding. Pt also reports sore throat and dysuria.

## 2015-06-14 ENCOUNTER — Encounter (HOSPITAL_COMMUNITY): Payer: Self-pay

## 2015-06-14 ENCOUNTER — Inpatient Hospital Stay (HOSPITAL_COMMUNITY)
Admit: 2015-06-14 | Discharge: 2015-06-14 | Disposition: A | Discharge status: Home / Self Care | Payer: Commercial Managed Care - PPO | Attending: Obstetrics and Gynecology | Admitting: Obstetrics and Gynecology

## 2015-06-14 ENCOUNTER — Ambulatory Visit (HOSPITAL_COMMUNITY)
Admit: 2015-06-14 | Discharge: 2015-06-14 | Disposition: A | Discharge status: Home / Self Care | Payer: Commercial Managed Care - PPO | Attending: Obstetrics and Gynecology | Admitting: Obstetrics and Gynecology

## 2015-06-14 DIAGNOSIS — N739 Female pelvic inflammatory disease, unspecified: Secondary | ICD-10-CM | POA: Insufficient documentation

## 2015-06-14 LAB — COMPREHENSIVE METABOLIC PANEL
ALBUMIN: 2.5 g/dL — AB (ref 3.5–5.0)
ALK PHOS: 76 U/L (ref 38–126)
ALT: 10 U/L — ABNORMAL LOW (ref 14–54)
AST: 9 U/L — AB (ref 15–41)
Anion gap: 7 (ref 5–15)
BILIRUBIN TOTAL: 0.8 mg/dL (ref 0.3–1.2)
CO2: 29 mmol/L (ref 22–32)
Calcium: 7.7 mg/dL — ABNORMAL LOW (ref 8.9–10.3)
Chloride: 103 mmol/L (ref 101–111)
Creatinine, Ser: 0.61 mg/dL (ref 0.44–1.00)
GFR calc Af Amer: >60 mL/min (ref >60)
GFR calc non Af Amer: >60 mL/min (ref >60)
GLUCOSE: 96 mg/dL (ref 65–99)
Potassium: 3.1 mmol/L — ABNORMAL LOW (ref 3.5–5.1)
Sodium: 139 mmol/L (ref 135–145)
TOTAL PROTEIN: 6.1 g/dL — AB (ref 6.5–8.1)

## 2015-06-14 LAB — CBC
HEMATOCRIT: 26.4 % — AB (ref 36.0–46.0)
HEMOGLOBIN: 8.5 g/dL — AB (ref 12.0–15.0)
MCH: 26.6 pg (ref 26.0–34.0)
MCHC: 32.2 g/dL (ref 30.0–36.0)
MCV: 82.8 fL (ref 78.0–100.0)
Platelets: 319 10*3/uL (ref 150–400)
RBC: 3.19 MIL/uL — ABNORMAL LOW (ref 3.87–5.11)
RDW: 13.9 % (ref 11.5–15.5)
WBC: 11.3 10*3/uL — AB (ref 4.0–10.5)

## 2015-06-14 LAB — PROTIME-INR
INR: 1.47 (ref 0.00–1.49)
PROTHROMBIN TIME: 17.9 s — AB (ref 11.6–15.2)

## 2015-06-14 MED ORDER — SIMETHICONE 80 MG PO CHEW
80.0000 mg | CHEWABLE_TABLET | Freq: Four times a day (QID) | ORAL | Status: DC | PRN
  Administered 2015-06-14: 80 mg via ORAL
  Filled 2015-06-14: qty 1

## 2015-06-14 MED ORDER — HYDROCODONE-ACETAMINOPHEN 5-325 MG PO TABS
1.0000 | ORAL_TABLET | ORAL | Status: DC | PRN
  Administered 2015-06-14 (×2): 1 via ORAL
  Filled 2015-06-14 (×2): qty 2
  Filled 2015-06-14 (×2): qty 1
  Filled 2015-06-14 (×2): qty 2

## 2015-06-14 MED ORDER — FENTANYL CITRATE (PF) 100 MCG/2ML IJ SOLN
INTRAMUSCULAR | Status: AC | PRN
  Administered 2015-06-14 (×2): 50 ug via INTRAVENOUS

## 2015-06-14 MED ORDER — DOCUSATE SODIUM 100 MG PO CAPS
100.0000 mg | ORAL_CAPSULE | Freq: Two times a day (BID) | ORAL | Status: DC
  Administered 2015-06-14 – 2015-06-17 (×6): 100 mg via ORAL
  Filled 2015-06-14 (×6): qty 1

## 2015-06-14 MED ORDER — METOCLOPRAMIDE HCL 10 MG PO TABS
5.0000 mg | ORAL_TABLET | Freq: Three times a day (TID) | ORAL | Status: DC
  Administered 2015-06-14 – 2015-06-15 (×2): 5 mg via ORAL
  Filled 2015-06-14 (×2): qty 1

## 2015-06-14 MED ORDER — MIDAZOLAM HCL 2 MG/2ML IJ SOLN
INTRAMUSCULAR | Status: AC | PRN
  Administered 2015-06-14 (×3): 1 mg via INTRAVENOUS

## 2015-06-14 MED ORDER — DEXTROSE IN LACTATED RINGERS 5 % IV SOLN
INTRAVENOUS | Status: DC
  Administered 2015-06-14 – 2015-06-17 (×4): via INTRAVENOUS

## 2015-06-14 MED ORDER — IBUPROFEN 600 MG PO TABS
600.0000 mg | ORAL_TABLET | Freq: Four times a day (QID) | ORAL | Status: DC | PRN
  Administered 2015-06-14 – 2015-06-17 (×10): 600 mg via ORAL
  Filled 2015-06-14 (×10): qty 1

## 2015-06-14 MED ORDER — MIDAZOLAM HCL 2 MG/2ML IJ SOLN
INTRAMUSCULAR | Status: AC
  Filled 2015-06-14: qty 6

## 2015-06-14 MED ORDER — FENTANYL CITRATE (PF) 100 MCG/2ML IJ SOLN
INTRAMUSCULAR | Status: AC
  Filled 2015-06-14: qty 4

## 2015-06-14 NOTE — Progress Notes (Addendum)
Talked with Chriss Czar RN  at [facility] with report.  [place]   Transported by [facility] to [facility].

## 2015-06-14 NOTE — Progress Notes (Signed)
Emptied 100cc of bloody drainage from Right pelvic drain

## 2015-06-14 NOTE — Progress Notes (Signed)
   06/14/15 1059  Medical Necessity for Transport Certificate --- IF THIS TRANSPORT IS ROUND TRIP OR SCHEDULED AND REPEATED, A PHYSICIAN MUST COMPLETE THIS FORM  Transport to (Location) [facility]  Reason for Transport Procedure  Name of [place] [facility]  Round Trip Transport? Yes  Q1 Are ALL the following "true" for this patient?              1. Unable to get up from bed without assistance  AND            2. Unable to ambulate AND        3. Unable to sit in a chair, including a wheelchair. No  Q2 Could the patient be transported safely by other means of transportation (I.E., wheelchair van)? No  Reason for transport - patient condition Requires IV maintenance  Q3 Reason based on Facility and/or [employer] not available at original facility  Specific Services Other (Comment) (interventional radiology)  Electronic Signature and Credentials (can be: Physician, RN, NP, PA, DP, CNS) Elliot Dally, RN)

## 2015-06-14 NOTE — Progress Notes (Signed)
Pt returns from CT guided drainage of suspected pelvic abcess @ [facility].  Discussed with Dr Vernard Gambles, malodorous, bloody fluid drained & drain left in place.  Reports pain RLQ.  No n/v - pt feels hungery.  No CP/SOB.  Reports + flatus this am.  No BM x 4-5 days.   Gen - NAD CV - RRR Lungs - clear bilaterally Abd - softly distended, + BS tender bilateral LQ R>L Ext - NT, no edema.  SCDs placed   A/P:  POD#0 s/p CT guided drainage of infected pelvic hematoma. S/p LAVH/BSO 05/25/15 Continue percocet, add ibuprofen for pain control Continue IV antibiotics (unasyn) Start bowel regimen - ordered colace, simethicone, reglan ordered

## 2015-06-14 NOTE — Progress Notes (Signed)
Notified [facility] for transportation.

## 2015-06-14 NOTE — Progress Notes (Signed)
Pt resting comfortably.  No n/v.  Pain controlled  AF, VSS Gen - NAD Abd soft, tender Bilat LQ.   Ext - NT, no edema  Labs reviewed, stable  A/P:  Postop complex fluid collection (13cm) - abcess vs hematoma Continue unasyn Plan for CT guided drainage today Plan of care reviewed with pt

## 2015-06-14 NOTE — Consult Note (Signed)
Chief Complaint: Patient was seen in consultation today for CT-guided pelvic abscess/hematoma drainage   Referring Physician(s): Tomblin,James  History of Present Illness: Kristina Castro is a 39 y.o. female who is status post LAVH/bilateral salpingectomy on 05/25/15 secondary to menorrhagia. She was discharged on 9/8 and readmitted on 9/23 with abdominal pain, intermittent fever and bright red blood per vagina as well as nausea/dizziness. Subsequent CT scan revealed a large complex pelvic abscess in the resection bed at site of recent hysterectomy measuring 13.3 x 8.8 x 7.7 cm. Request has now been received for CT guided drainage of the above mentioned pelvic abscess.  Past Medical History  Diagnosis Date  . HSV-2 (herpes simplex virus 2) infection 2007    currently taking meds.  . History of gastritis     1998  . History of abnormal cervical Pap smear   . History of gestational diabetes   . History of small bowel obstruction     05-11-2013 resolved without surgical intervention  . Heavy menstrual bleeding   . GERD (gastroesophageal reflux disease)   . History of cardiac murmur     only during one pregancy--     Past Surgical History  Procedure Laterality Date  . Dilation and evacuation  x3   last one 2011  . Laparoscopic assisted vaginal hysterectomy N/A 05/25/2015    Procedure: LAPAROSCOPIC ASSISTED VAGINAL HYSTERECTOMY;  Surgeon: Linda Hedges, DO;  Location: [facility];  Service: Gynecology;  Laterality: N/A;  . Bilateral salpingectomy Bilateral 05/25/2015    Procedure: BILATERAL SALPINGECTOMY;  Surgeon: Linda Hedges, DO;  Location: [place];  Service: Gynecology;  Laterality: Bilateral;  . Iud removal Left 05/25/2015    Procedure: NEXPLANON REMOVAL LEFT UPPER ARM;  Surgeon: Linda Hedges, DO;  Location: [place];  Service: Gynecology;  Laterality: Left;    Allergies: Adhesive  Medications: Prior to Admission medications     Medication Sig Start Date End Date Taking? Authorizing Provider  acyclovir (ZOVIRAX) 800 MG tablet Take 800 mg by mouth daily.      Historical Provider, MD  docusate sodium (COLACE) 100 MG capsule Take 1 capsule (100 mg total) by mouth 2 (two) times daily. 05/27/15   Megan Morris, DO  ferrous sulfate 325 (65 FE) MG tablet Take 1 tablet (325 mg total) by mouth 2 (two) times daily with a meal. 05/27/15   Megan Morris, DO  ibuprofen (ADVIL,MOTRIN) 800 MG tablet Take 1 tablet (800 mg total) by mouth every 6 (six) hours as needed. 05/27/15   Megan Morris, DO  oxyCODONE-acetaminophen (PERCOCET/ROXICET) 5-325 MG per tablet Take 1-2 tablets by mouth every 4 (four) hours as needed (moderate to severe pain (when tolerating fluids)). 05/27/15   Linda Hedges, DO     Family History  Problem Relation Age of Onset  . Anesthesia problems Neg Hx   . Cancer Mother     breast  . Diabetes Mother   . Hypertension Father   . Hypertension Paternal Aunt   . Stroke Maternal Grandmother   . Diabetes Maternal Grandmother     Social History   Social History  . Marital Status: Divorced    Spouse Name: N/A  . Number of Children: N/A  . Years of Education: N/A   Social History Main Topics  . Smoking status: Current Some Day Smoker -- 0.00 packs/day for 5 years    Types: Cigars  . Smokeless tobacco: Never Used     Comment: hx smoking on and  off--  currently cigar's 1pp2wk occasional  . Alcohol Use: 4.2 oz/week    0 Standard drinks or equivalent, 7 Glasses of wine per week     Comment: daily wine  . Drug Use: No  . Sexual Activity: Yes    Birth Control/ Protection: Implant     Comment: nexplanon implant   Other Topics Concern  . Not on file   Social History Narrative      Review of Systems patient currently denies fever, chest pain, cough, dysuria, hematuria or vomiting. She does have occasional headaches, lightheadedness, mild dyspnea, abdominal and back pain as well as occasional nausea and some vaginal  bleeding.  Vital Signs: LMP 05/25/2015 VSS; AF  Physical Exam  Constitutional: She is oriented to person, place, and time. She appears well-developed and well-nourished.  Cardiovascular: Normal rate and regular rhythm.   Pulmonary/Chest: Effort normal and breath sounds normal.  Abdominal: Soft. Bowel sounds are normal. There is tenderness.  Musculoskeletal: Normal range of motion. She exhibits no edema.  Neurological: She is alert and oriented to person, place, and time.    Mallampati Score:     Imaging: Ct Abdomen Pelvis W Contrast  06/13/2015   CLINICAL DATA:  39 year old female status post hysterectomy on May 25, 2015. Fever and lower abdominal cramping for the past 2 days. Some associated dizziness and 3 episodes of emesis. Vaginal bleeding today. Left lower quadrant abdominal pain.  EXAM: CT ABDOMEN AND PELVIS WITH CONTRAST  TECHNIQUE: Multidetector CT imaging of the abdomen and pelvis was performed using the standard protocol following bolus administration of intravenous contrast.  CONTRAST:  157mL OMNIPAQUE IOHEXOL 300 MG/ML SOLN, 66mL OMNIPAQUE IOHEXOL 300 MG/ML SOLN  COMPARISON:  CT the abdomen and pelvis 05/11/2013.  FINDINGS: Lower chest:  Unremarkable.  Hepatobiliary: No suspicious cystic or solid hepatic lesions. No intra or extrahepatic biliary ductal dilatation. Gallbladder is unremarkable in appearance.  Pancreas: No pancreatic mass. No pancreatic ductal dilatation. No pancreatic or peripancreatic fluid or inflammatory changes.  Spleen: Unremarkable.  Adrenals/Urinary Tract: Normal appearance of the kidneys and adrenal glands bilaterally. No hydroureteronephrosis or perinephric stranding to indicate urinary tract obstruction at this time. Urinary bladder is normal in appearance.  Stomach/Bowel: Normal appearance of the stomach. No pathologic dilatation of small bowel or colon. Normal appendix.  Vascular/Lymphatic: No significant atherosclerotic calcifications or definite  aneurysm identified in the abdominal or pelvic vasculature. Numerous prominent borderline enlarged retroperitoneal and mesenteric lymph nodes are noted, likely reactive.  Reproductive: Status post hysterectomy. In the resection bed there is a large 11.3 x 8.8 x 7.7 cm centrally low to intermediate attenuation fluid collection with thick rim of enhancement and some internal loculations, compatible with a large abscess. Surrounding inflammatory changes throughout the adjacent pelvis. Ovaries are not clearly identified on today's examination and may be surgically absent.  Other: Trace volume of ascites.  No pneumoperitoneum.  Musculoskeletal: There are no aggressive appearing lytic or blastic lesions noted in the visualized portions of the skeleton.  IMPRESSION: 1. Large complex pelvic abscess in the resection bed at site of recent hysterectomy measuring 13.3 x 8.8 x 7.7 cm, as above. 2. Additional incidental findings, as above. These results will be called to the ordering clinician or representative by the Radiologist Assistant, and communication documented in the PACS or zVision Dashboard.   Electronically Signed   By: Vinnie Langton M.D.   On: 06/13/2015 15:01    Labs:  CBC:  Recent Labs  05/26/15 2030 05/27/15 0535 06/13/15 1303 06/14/15 0515  WBC 14.5* 12.9* 13.1* 11.3*  HGB 8.3* 9.0* 9.9* 8.5*  HCT 25.3* 26.6* 30.0* 26.4*  PLT 187 208 346 319    COAGS:  Recent Labs  06/14/15 0745  INR 1.47    BMP:  Recent Labs  05/20/15 0759 05/26/15 0600 06/13/15 1303 06/14/15 0515  NA 136 138 140 139  K 4.0 4.0 3.1* 3.1*  CL 107 105 103 103  CO2 23 23 28 29   GLUCOSE 152* 137* 91 96  BUN 8 13 6  <5*  CALCIUM 8.7* 8.5* 8.5* 7.7*  CREATININE 0.71 0.86 0.70 0.61  GFRNONAA >60 >60 >60 >60  GFRAA >60 >60 >60 >60    LIVER FUNCTION TESTS:  Recent Labs  05/20/15 0759 05/26/15 0600 06/13/15 1303 06/14/15 0515  BILITOT 0.4 0.4 0.9 0.8  AST 21 17 13* 9*  ALT 12* 10* 13* 10*    ALKPHOS 91 57 92 76  PROT 7.3 5.7* 7.2 6.1*  ALBUMIN 3.7 3.1* 3.2* 2.5*    TUMOR MARKERS: No results for input(s): AFPTM, CEA, CA199, CHROMGRNA in the last 8760 hours.  Assessment and Plan: Patient status post recent LAVH/bilateral salpingectomy on 9/6. Now with abdominal/back pain, nausea, lightheadedness, bright red blood per vagina and imaging findings of large complex pelvic abscess in the resection bed at site of recent hysterectomy; WBC currently 11.3, hemoglobin 8.5, platelets 319K, PT 17.9/INR 1.47; plan is for CT-guided drainage of the abscess/hematoma today. Details/risks of procedure, including but not limited to, internal bleeding, infection, injury to adjacent organs, inability to place drain, discussed with patient with her understanding and consent.   Thank you for this interesting consult.  I greatly enjoyed meeting Kristina Castro and look forward to participating in their care.  A copy of this report was sent to the requesting provider on this date.  Signed: D. Rowe Robert 06/14/2015, 1:33 PM   I spent a total of 15 minutes in face to face in clinical consultation, greater than 50% of which was counseling/coordinating care for CT-guided pelvic abscess drainage

## 2015-06-14 NOTE — Procedures (Signed)
CT guided placement 68f drain into pelvic collection 68ml bloody fluid aspirate sent for GS, C&S No complication No blood loss. See complete dictation in [facility].

## 2015-06-15 LAB — CBC
HCT: 26.7 % — ABNORMAL LOW (ref 36.0–46.0)
HEMOGLOBIN: 8.7 g/dL — AB (ref 12.0–15.0)
MCH: 26.9 pg (ref 26.0–34.0)
MCHC: 32.6 g/dL (ref 30.0–36.0)
MCV: 82.4 fL (ref 78.0–100.0)
PLATELETS: 319 10*3/uL (ref 150–400)
RBC: 3.24 MIL/uL — AB (ref 3.87–5.11)
RDW: 13.8 % (ref 11.5–15.5)
WBC: 12.3 10*3/uL — AB (ref 4.0–10.5)

## 2015-06-15 MED ORDER — PIPERACILLIN-TAZOBACTAM 3.375 G IVPB
3.3750 g | Freq: Three times a day (TID) | INTRAVENOUS | Status: DC
  Administered 2015-06-15 – 2015-06-17 (×7): 3.375 g via INTRAVENOUS
  Filled 2015-06-15 (×9): qty 50

## 2015-06-15 NOTE — Progress Notes (Signed)
Referring Physician(s): Dr Gaetano Net  Chief Complaint:  Pelvic abscess Post hysterectomy  Subjective:  Drain placed 9/26 in IR Pt some better today per RN Has been ambulating Eating some Had BM last night Still with some abd pain  Allergies: Adhesive  Medications: Prior to Admission medications   Medication Sig Start Date End Date Taking? Authorizing Provider  acyclovir (ZOVIRAX) 800 MG tablet Take 800 mg by mouth daily.     Yes Historical Provider, MD  docusate sodium (COLACE) 100 MG capsule Take 1 capsule (100 mg total) by mouth 2 (two) times daily. 05/27/15  Yes Megan Morris, DO  ferrous sulfate 325 (65 FE) MG tablet Take 1 tablet (325 mg total) by mouth 2 (two) times daily with a meal. 05/27/15  Yes Megan Morris, DO  ibuprofen (ADVIL,MOTRIN) 800 MG tablet Take 1 tablet (800 mg total) by mouth every 6 (six) hours as needed. 05/27/15  Yes Megan Morris, DO  oxyCODONE-acetaminophen (PERCOCET/ROXICET) 5-325 MG per tablet Take 1-2 tablets by mouth every 4 (four) hours as needed (moderate to severe pain (when tolerating fluids)). 05/27/15  Yes Megan Morris, DO     Vital Signs: BP 117/65 mmHg  Pulse 87  Temp(Src) 98.3 F (36.8 C) (Oral)  Resp 18  Ht 5\' 5"  (1.651 m)  Wt 202 lb (91.627 kg)  BMI 33.61 kg/m2  SpO2 99%  LMP 05/25/2015  Physical Exam  Imaging: Ct Abdomen Pelvis W Contrast  06/13/2015   CLINICAL DATA:  39 year old female status post hysterectomy on May 25, 2015. Fever and lower abdominal cramping for the past 2 days. Some associated dizziness and 3 episodes of emesis. Vaginal bleeding today. Left lower quadrant abdominal pain.  EXAM: CT ABDOMEN AND PELVIS WITH CONTRAST  TECHNIQUE: Multidetector CT imaging of the abdomen and pelvis was performed using the standard protocol following bolus administration of intravenous contrast.  CONTRAST:  137mL OMNIPAQUE IOHEXOL 300 MG/ML SOLN, 15mL OMNIPAQUE IOHEXOL 300 MG/ML SOLN  COMPARISON:  CT the abdomen and pelvis  05/11/2013.  FINDINGS: Lower chest:  Unremarkable.  Hepatobiliary: No suspicious cystic or solid hepatic lesions. No intra or extrahepatic biliary ductal dilatation. Gallbladder is unremarkable in appearance.  Pancreas: No pancreatic mass. No pancreatic ductal dilatation. No pancreatic or peripancreatic fluid or inflammatory changes.  Spleen: Unremarkable.  Adrenals/Urinary Tract: Normal appearance of the kidneys and adrenal glands bilaterally. No hydroureteronephrosis or perinephric stranding to indicate urinary tract obstruction at this time. Urinary bladder is normal in appearance.  Stomach/Bowel: Normal appearance of the stomach. No pathologic dilatation of small bowel or colon. Normal appendix.  Vascular/Lymphatic: No significant atherosclerotic calcifications or definite aneurysm identified in the abdominal or pelvic vasculature. Numerous prominent borderline enlarged retroperitoneal and mesenteric lymph nodes are noted, likely reactive.  Reproductive: Status post hysterectomy. In the resection bed there is a large 11.3 x 8.8 x 7.7 cm centrally low to intermediate attenuation fluid collection with thick rim of enhancement and some internal loculations, compatible with a large abscess. Surrounding inflammatory changes throughout the adjacent pelvis. Ovaries are not clearly identified on today's examination and may be surgically absent.  Other: Trace volume of ascites.  No pneumoperitoneum.  Musculoskeletal: There are no aggressive appearing lytic or blastic lesions noted in the visualized portions of the skeleton.  IMPRESSION: 1. Large complex pelvic abscess in the resection bed at site of recent hysterectomy measuring 13.3 x 8.8 x 7.7 cm, as above. 2. Additional incidental findings, as above. These results will be called to the ordering clinician or representative by the  Psychologist, clinical, and communication documented in the PACS or zVision Dashboard.   Electronically Signed   By: Vinnie Langton M.D.    On: 06/13/2015 15:01   Ct Image Guided Drainage By Percutaneous Catheter  06/14/2015   CLINICAL DATA:  Recent hysterectomy. Intermittent chills. CT demonstrates peripherally enhancing pelvic fluid collection  EXAM: CT GUIDED DRAINAGE OF PELVIC ABSCESS  ANESTHESIA/SEDATION: Intravenous Fentanyl and Versed were administered as conscious sedation during continuous cardiorespiratory monitoring by the radiology RN, with a total moderate sedation time of 17 minutes.  PROCEDURE: The procedure, risks, benefits, and alternatives were explained to the patient. Questions regarding the procedure were encouraged and answered. The patient understands and consents to the procedure.  Patient placed in left posterior oblique positioning. Select axial scans through the pelvis were obtained. The collection was localized and an appropriate skin entry site was determined and marked.  The operative field was prepped with Betadine/chlorhexidinein a sterile fashion, and a sterile drape was applied covering the operative field. A sterile gown and sterile gloves were used for the procedure. Local anesthesia was provided with 1% Lidocaine.  Under CT fluoroscopic guidance, an 18 gauge trocar needle was advanced into the collection. Bloody malodorous fluid was aspirated. An Amplatz guidewire advanced easily within the collection, its position confirmed on CT. Tract was dilated to facilitate placement of a 12 French pigtail catheter, formed centrally within the collection. Catheter position confirmed on CT. Catheter secured externally with 0 Prolene suture and StatLock and placed to gravity bag. The patient tolerated the procedure well.  COMPLICATIONS: None immediate  FINDINGS: Central pelvic fluid collection was the again localized. A small amount of bloody malodorous fluid was aspirated, and this was sent for Gram stain, culture and sensitivity. A 12 French pigtail drain catheter was then placed under CT guidance.  IMPRESSION: 1.  Technically successful CT-guided pelvic abscess drain catheter placement. A sample of the aspirate was sent for Gram stain, culture and sensitivity.   Electronically Signed   By: Lucrezia Europe M.D.   On: 06/14/2015 15:53    Labs:  CBC:  Recent Labs  05/27/15 0535 06/13/15 1303 06/14/15 0515 06/15/15 0600  WBC 12.9* 13.1* 11.3* 12.3*  HGB 9.0* 9.9* 8.5* 8.7*  HCT 26.6* 30.0* 26.4* 26.7*  PLT 208 346 319 319    COAGS:  Recent Labs  06/14/15 0745  INR 1.47    BMP:  Recent Labs  05/20/15 0759 05/26/15 0600 06/13/15 1303 06/14/15 0515  NA 136 138 140 139  K 4.0 4.0 3.1* 3.1*  CL 107 105 103 103  CO2 23 23 28 29   GLUCOSE 152* 137* 91 96  BUN 8 13 6  <5*  CALCIUM 8.7* 8.5* 8.5* 7.7*  CREATININE 0.71 0.86 0.70 0.61  GFRNONAA >60 >60 >60 >60  GFRAA >60 >60 >60 >60    LIVER FUNCTION TESTS:  Recent Labs  05/20/15 0759 05/26/15 0600 06/13/15 1303 06/14/15 0515  BILITOT 0.4 0.4 0.9 0.8  AST 21 17 13* 9*  ALT 12* 10* 13* 10*  ALKPHOS 91 57 92 76  PROT 7.3 5.7* 7.2 6.1*  ALBUMIN 3.7 3.1* 3.2* 2.5*    Assessment and Plan:  Pelvic abscess post hysterectomy Bloody output per RN Site clean and dry per RN  will follow by phone Plan for re CT when output less than 10 cc/day Order to flush drain and record  Signed: TURPIN,PAMELA A 06/15/2015, 7:39 AM   I spent a total of 15 Minutes at the the patient's bedside AND on  the patient's hospital floor or unit, greater than 50% of which was counseling/coordinating care for abscess drain

## 2015-06-15 NOTE — Progress Notes (Signed)
*   No surgery found *  Subjective: Patient reports tolerating PO, + BM and no problems voiding.  Temp max 101.3 yesterday S/P CT guided drainage of 13 cm pelvic abscess/hemtoma Had 2 BMs yesterday  Objective: I have reviewed patient's vital signs, intake and output, labs and microbiology.  C&S pending WBC elevated mildly, Hgb stable  General: alert, cooperative, appears stated age and mild distress GI: soft, non-tender; bowel sounds normal; no masses,  no organomegaly Vaginal Bleeding: minimal  Assessment: s/p * No surgery found *: stable, tolerating diet, post-op fever and Bowels begining to improve  Plan: Will change to Broad spectrum Abx due to potential abscess.  Pharmacy recomends Zosyn.  Awating culture reults  Recheck CBC in am CT when abscess drainage less than 10cc.  Yesterday about 100cc  LOS: 2 days    LOWE,DAVID C 06/15/2015, 9:54 AM

## 2015-06-15 NOTE — Progress Notes (Signed)
ANTIBIOTIC CONSULT NOTE - INITIAL  Pharmacy Consult for Zosyn Indication: pelvic abscess  Allergies  Allergen Reactions  . Adhesive [Tape] Rash    All tapes including paper tape    Patient Measurements: Height: 5\' 5"  (165.1 cm) Weight: 202 lb (91.627 kg) IBW/kg (Calculated) : 57  Vital Signs: Temp: 98.3 F (36.8 C) (09/27 0615) BP: 117/65 mmHg (09/27 0615) Pulse Rate: 87 (09/27 0615) Intake/Output from previous day: 09/26 0701 - 09/27 0700 In: 3493.3 [P.O.:360; I.V.:3033.3; IV Piggyback:100] Out: 1518 [Urine:1500; Drains:18] Intake/Output from this shift: Total I/O In: 507.5 [I.V.:402.5; Other:5; IV Piggyback:100] Out: 901 [Urine:900; Stool:1]  Labs:  Recent Labs  06/13/15 1303 06/14/15 0515 06/15/15 0600  WBC 13.1* 11.3* 12.3*  HGB 9.9* 8.5* 8.7*  PLT 346 319 319  CREATININE 0.70 0.61  --    Estimated Creatinine Clearance: 106.6 mL/min (by C-G formula based on Cr of 0.61).    Microbiology: Recent Results (from the past 720 hour(s))  Culture, routine-abscess     Status: None (Preliminary result)   Collection Time: 06/14/15  3:37 PM  Result Value Ref Range Status   Specimen Description ABSCESS DRAINAGE  Final   Special Requests NONE  Final   Gram Stain PENDING  Incomplete   Culture NO GROWTH Performed at [facility]   Final   Report Status PENDING  Incomplete    Medical History: Past Medical History  Diagnosis Date  . HSV-2 (herpes simplex virus 2) infection 2007    currently taking meds.  . History of gastritis     1998  . History of abnormal cervical Pap smear   . History of gestational diabetes   . History of small bowel obstruction     05-11-2013 resolved without surgical intervention  . Heavy menstrual bleeding   . GERD (gastroesophageal reflux disease)   . History of cardiac murmur     only during one pregancy--     Medications:  Unasyn 3g IV q6h (started 9/25, last dose 0405 today)  Assessment: Pt is a 39yo F being  initiated on Zosyn for pelvic abscess s/p LAVH on 9/6. She has remained febrile (Tmax 101.3) on Unasyn and broader spectrum coverage is desired.   Goal of Therapy:  eradication of infection  Plan:  1. Zosyn 3.375g IV q8h infused over 4 hours, first dose now 2. Follow up culture results from abscess drainage, fever curves and patient clinical improvement  [place], Mindy Martinique 06/15/2015,10:12 AM

## 2015-06-15 NOTE — Progress Notes (Signed)
Patient ID: Kristina Castro, female   DOB: [birth year], 39 y.o.   MRN: [id number] Pt feeling better today Pain/pressure still.  + flatus and tol po Pain controlled with oral meds VSSAF  Drain 20cc after irrigation with 10cc  Micro:  PMNs, Mod gram pos cocci in pairs, Culture pending  Abd soft, bandage at drain site clean and dry  Abscess/hematoma - cont zosyn.  When < 10cc the CT guided removal Fu culture

## 2015-06-16 LAB — CBC WITH DIFFERENTIAL/PLATELET
BASOS ABS: 0 10*3/uL (ref 0.0–0.1)
Basophils Relative: 0 %
EOS PCT: 3 %
Eosinophils Absolute: 0.4 10*3/uL (ref 0.0–0.7)
HCT: 26.5 % — ABNORMAL LOW (ref 36.0–46.0)
HEMOGLOBIN: 8.3 g/dL — AB (ref 12.0–15.0)
LYMPHS ABS: 2.5 10*3/uL (ref 0.7–4.0)
LYMPHS PCT: 20 %
MCH: 25.9 pg — ABNORMAL LOW (ref 26.0–34.0)
MCHC: 31.3 g/dL (ref 30.0–36.0)
MCV: 82.8 fL (ref 78.0–100.0)
Monocytes Absolute: 0.7 10*3/uL (ref 0.1–1.0)
Monocytes Relative: 6 %
NEUTROS PCT: 72 %
Neutro Abs: 9 10*3/uL — ABNORMAL HIGH (ref 1.7–7.7)
PLATELETS: 346 10*3/uL (ref 150–400)
RBC: 3.2 MIL/uL — AB (ref 3.87–5.11)
RDW: 13.9 % (ref 11.5–15.5)
WBC: 12.5 10*3/uL — AB (ref 4.0–10.5)

## 2015-06-16 NOTE — Progress Notes (Signed)
Subjective: Patient reports tolerating PO, + flatus and no problems voiding.    Objective: I have reviewed patient's vital signs, intake and output and labs.  General: alert GI: positive bowel sounds.  minimal tenderness no peritoneal signs. incisions clear.  minimal drainage form tube Vaginal Bleeding: minimal   Assessment/Plan: Post op infected hematoma  Patient fells much better  No fever yesterday.  Will continue Zosyn today.  Saline lock.   LOS: 3 days    Kristina Castro S 06/16/2015, 8:09 AM

## 2015-06-17 ENCOUNTER — Other Ambulatory Visit: Payer: Self-pay | Admitting: Radiology

## 2015-06-17 ENCOUNTER — Inpatient Hospital Stay (HOSPITAL_COMMUNITY): Payer: Commercial Managed Care - PPO

## 2015-06-17 DIAGNOSIS — IMO0001 Reserved for inherently not codable concepts without codable children: Secondary | ICD-10-CM

## 2015-06-17 DIAGNOSIS — K651 Peritoneal abscess: Principal | ICD-10-CM

## 2015-06-17 DIAGNOSIS — T814XXD Infection following a procedure, subsequent encounter: Principal | ICD-10-CM

## 2015-06-17 LAB — CULTURE, ROUTINE-ABSCESS

## 2015-06-17 MED ORDER — AMOXICILLIN-POT CLAVULANATE 875-125 MG PO TABS: 1.0000 | ORAL_TABLET | Freq: Two times a day (BID) | ORAL | Status: DC

## 2015-06-17 MED ORDER — IOHEXOL 300 MG/ML  SOLN
50.0000 mL | Freq: Once | INTRAMUSCULAR | Status: AC | PRN
  Administered 2015-06-17: 50 mL via ORAL

## 2015-06-17 MED ORDER — OXYCODONE-ACETAMINOPHEN 5-325 MG PO TABS: 1.0000 | ORAL_TABLET | ORAL | Status: DC | PRN

## 2015-06-17 MED ORDER — IOHEXOL 300 MG/ML  SOLN
100.0000 mL | Freq: Once | INTRAMUSCULAR | Status: AC | PRN
  Administered 2015-06-17: 100 mL via INTRAVENOUS

## 2015-06-17 MED ORDER — ONDANSETRON HCL 4 MG/2ML IJ SOLN
4.0000 mg | Freq: Four times a day (QID) | INTRAMUSCULAR | Status: DC | PRN
  Administered 2015-06-17: 4 mg via INTRAVENOUS
  Filled 2015-06-17: qty 2

## 2015-06-17 MED ORDER — AMOXICILLIN-POT CLAVULANATE 875-125 MG PO TABS
1.0000 | ORAL_TABLET | Freq: Two times a day (BID) | ORAL | Status: DC
  Administered 2015-06-17: 1 via ORAL
  Filled 2015-06-17 (×3): qty 1

## 2015-06-17 NOTE — Progress Notes (Signed)
Patient A/O x4. Pain controlled. Vital signs WNL. Reviewed discharge instructions to include care of gravity drain. Reviewed follow-up appointments and questions answered. Rx for percocet given to patient. Home meds collected from pharmacy and given to patient.  Patient accompanied by nursing staff to vehicle via wheelchair. Belongings accounted for.

## 2015-06-17 NOTE — Progress Notes (Signed)
Patient reports feeling well as long as she receives Ibuprofen/Percocet.  No n/v.  When she has pain, it is at drain site.  +Flatus and BM.  AF x >48 hours.  VSS. AF. Gen: A&O x 3 Abd: soft, NT, drain in RLQ with scant bloody fluid (including flush).   Ext: no c/c/e  HD5 with postop pelvic abscess/hematoma; drain placed -Awaiting sensitivies.  Continue Zosyn until return and change to po meds at that time. -Will call Radiology for possible CT guided removal of drain -Continue pain control  Linda Hedges, DO

## 2015-06-17 NOTE — Discharge Summary (Signed)
Physician Discharge Summary  Patient ID: Kristina Castro MRN: [id number] DOB/AGE: [birth year] 38 y.o.  Admit date: 06/13/2015 Discharge date: 06/17/2015  Admission Diagnoses:  Pelvic hematoma  Discharge Diagnoses:   SAA Active Problems:   Pelvic mass in female   Discharged Condition: good  Hospital Course: Patient was admitted with postop (s/p LAVH) fever and pain.  CT showed large (13cm) pelvic hematoma.  IR was consulted on HD2 for CT guided drain placement which was performed without complication.  Culture of drainage returned as group B strep.  The patient received broad spectrum IV antibiotics until sensitivities returned and was changed to po Amoxicillin.  The patient remained afebrile after antibiotics were started.  When scant drain fluid (<10cc) was noted on HD5, repeat CT showed much smaller (7.8cm) hematoma with appropriate drain placement.  Based on need to continue drainage, the patient was discharged home with close outpt follow-up.  Consults: interventional radiology  Significant Diagnostic Studies: microbiology: wound culture: positive for GBS and radiology: CT scan: pelvic hematoma  Treatments: antibiotics: Zosyn and procedures: percutaneous drainage catheter placement  Discharge Exam: Blood pressure 124/69, pulse 82, temperature 97.9 F (36.6 C), temperature source Oral, resp. rate 20, height 5\' 5"  (1.651 m), weight 202 lb (91.627 kg), last menstrual period 05/25/2015, SpO2 100 %. General appearance: alert, cooperative and appears stated age GI: soft, ND, inc c/d x 2.  Drain in RLQ Extremities: extremities normal, atraumatic, no cyanosis or edema  Disposition: 01-Home or Self Care     Medication List    TAKE these medications        acyclovir 800 MG tablet  Commonly known as:  ZOVIRAX  Take 800 mg by mouth daily.     amoxicillin-clavulanate 875-125 MG tablet  Commonly known as:  AUGMENTIN  Take 1 tablet by mouth every 12 (twelve) hours.     docusate  sodium 100 MG capsule  Commonly known as:  COLACE  Take 1 capsule (100 mg total) by mouth 2 (two) times daily.     ferrous sulfate 325 (65 FE) MG tablet  Take 1 tablet (325 mg total) by mouth 2 (two) times daily with a meal.     ibuprofen 800 MG tablet  Commonly known as:  ADVIL  Take 1 tablet (800 mg total) by mouth every 6 (six) hours as needed.     oxyCODONE-acetaminophen 5-325 MG tablet  Commonly known as:  PERCOCET/ROXICET  Take 1-2 tablets by mouth every 4 (four) hours as needed (moderate to severe pain (when tolerating fluids)).     oxyCODONE-acetaminophen 5-325 MG tablet  Commonly known as:  PERCOCET/ROXICET  Take 1-2 tablets by mouth every 4 (four) hours as needed (moderate to severe pain (when tolerating fluids)).         SignedLinda Hedges 06/17/2015, 8:05 PM

## 2015-06-17 NOTE — Progress Notes (Signed)
CT today reviewed with Dr. Earleen Newport. IR would recommend continuing drain and patient to F/U in IR drain clinic across from [facility] hospital (I will place appt order and our schedulers will call the patient to setup) F/u for next week with repeat CT pelvis. Instruct patient to keep daily log of output amount and to have her flush the drain with 5 cc sterile saline twice daily. Clinic # [phone]  Tsosie Billing PA-C Interventional Radiology  06/17/15  2:21 PM

## 2015-06-17 NOTE — Progress Notes (Signed)
Patient feeling well this PM.  Just took shower.  CT pelvic shows smaller fluid collection (7.8 cm) with appropriate placement of drain.  Sensitivities returned on drainage-PCN  Will d/c home with f/u in office Tuesday. Per IR, pt to f/u in IR drain clinic for repeat CT pelvic.  Pt to flush drain bid.  Linda Hedges, DO

## 2015-06-17 NOTE — Discharge Instructions (Signed)
Call MD for T>100.4, vaginal bleeding, severe abdominal pain, intractable nausea and/or vomiting.  Call office to schedule f/u appointment on Tuesday.  Follow up at IR drain clinic per recommendations.  Flush drain with 5cc saline bid.  Keep daily log of drain output.

## 2015-06-21 ENCOUNTER — Other Ambulatory Visit (HOSPITAL_COMMUNITY): Payer: Self-pay | Admitting: Interventional Radiology

## 2015-06-21 ENCOUNTER — Other Ambulatory Visit: Payer: Self-pay | Admitting: Obstetrics and Gynecology

## 2015-06-21 DIAGNOSIS — T814XXD Infection following a procedure, subsequent encounter: Principal | ICD-10-CM

## 2015-06-21 DIAGNOSIS — IMO0001 Reserved for inherently not codable concepts without codable children: Secondary | ICD-10-CM

## 2015-06-21 DIAGNOSIS — K651 Peritoneal abscess: Principal | ICD-10-CM

## 2015-06-24 ENCOUNTER — Ambulatory Visit
Admission: RE | Admit: 2015-06-24 | Discharge: 2015-06-24 | Disposition: A | Discharge status: Home / Self Care | Payer: Commercial Managed Care - PPO | Source: Ambulatory Visit | Attending: Radiology | Admitting: Radiology

## 2015-06-24 ENCOUNTER — Ambulatory Visit
Admission: RE | Admit: 2015-06-24 | Discharge: 2015-06-24 | Disposition: A | Discharge status: Home / Self Care | Payer: Commercial Managed Care - PPO | Source: Ambulatory Visit | Attending: Interventional Radiology | Admitting: Interventional Radiology

## 2015-06-24 ENCOUNTER — Ambulatory Visit
Admission: RE | Admit: 2015-06-24 | Discharge: 2015-06-24 | Disposition: A | Discharge status: Home / Self Care | Payer: Commercial Managed Care - PPO | Source: Ambulatory Visit | Attending: Obstetrics and Gynecology | Admitting: Obstetrics and Gynecology

## 2015-06-24 ENCOUNTER — Other Ambulatory Visit: Payer: Self-pay | Admitting: Obstetrics and Gynecology

## 2015-06-24 DIAGNOSIS — T814XXD Infection following a procedure, subsequent encounter: Principal | ICD-10-CM

## 2015-06-24 DIAGNOSIS — IMO0001 Reserved for inherently not codable concepts without codable children: Secondary | ICD-10-CM

## 2015-06-24 DIAGNOSIS — K651 Peritoneal abscess: Principal | ICD-10-CM

## 2015-06-24 MED ORDER — IOPAMIDOL (ISOVUE-300) INJECTION 61%
100.0000 mL | Freq: Once | INTRAVENOUS | Status: AC | PRN
  Administered 2015-06-24: 100 mL via INTRAVENOUS

## 2015-06-24 NOTE — Progress Notes (Signed)
Patient ID: Marisa Sprinkles, female   DOB: 08-25-1976, 39 y.o.   MRN: [id number]   Referring Physician(s): Morris,M  Chief Complaint: The patient is seen in follow up today s/p central pelvic abscess drainage on 06/14/15  History of present illness: DELONNA NEY is a 39 y.o. female who is status post LAVH/bilateral salpingectomy on 05/25/15 secondary to menorrhagia. She was discharged on 9/8 and readmitted on 9/23 with abdominal pain, intermittent fever and bright red blood per vagina as well as nausea/dizziness. Subsequent CT scan revealed a large complex pelvic abscess in the resection bed at site of recent hysterectomy measuring 13.3 x 8.8 x 7.7 cm. On 06/14/15 the patient underwent CT-guided drainage of the central pelvic fluid collection at [facility]. The patient was discharged home on 06/17/15 from [facility]. She presents today for follow-up CT and drain evaluation. She denies any recent fevers, chills, nausea or vomiting. Cultures from the abscess fluid grew Streptococcus. She has been on antibiotic therapy since discharge with last dose on 06/23/15. She reports minimal output of bloody serous fluid from the drain. She is flushing drain with sterile normal saline twice daily. Only significant complaint at this time is some soreness at the drain insertion site.   Past Medical History  Diagnosis Date  . HSV-2 (herpes simplex virus 2) infection 2007    currently taking meds.  . History of gastritis     1998  . History of abnormal cervical Pap smear   . History of gestational diabetes   . History of small bowel obstruction     05-11-2013 resolved without surgical intervention  . Heavy menstrual bleeding   . GERD (gastroesophageal reflux disease)   . History of cardiac murmur     only during one pregancy--     Past Surgical History  Procedure Laterality Date  . Dilation and evacuation  x3   last one 2011  . Laparoscopic assisted vaginal hysterectomy N/A 05/25/2015   Procedure: LAPAROSCOPIC ASSISTED VAGINAL HYSTERECTOMY;  Surgeon: Linda Hedges, DO;  Location: [place];  Service: Gynecology;  Laterality: N/A;  . Bilateral salpingectomy Bilateral 05/25/2015    Procedure: BILATERAL SALPINGECTOMY;  Surgeon: Linda Hedges, DO;  Location: [place];  Service: Gynecology;  Laterality: Bilateral;  . Iud removal Left 05/25/2015    Procedure: NEXPLANON REMOVAL LEFT UPPER ARM;  Surgeon: Linda Hedges, DO;  Location: [place];  Service: Gynecology;  Laterality: Left;    Allergies: Adhesive  Medications: Prior to Admission medications   Medication Sig Start Date End Date Taking? Authorizing Provider  acyclovir (ZOVIRAX) 800 MG tablet Take 800 mg by mouth daily.      Historical Provider, MD  amoxicillin-clavulanate (AUGMENTIN) 875-125 MG tablet Take 1 tablet by mouth every 12 (twelve) hours. 06/17/15   Megan Morris, DO  docusate sodium (COLACE) 100 MG capsule Take 1 capsule (100 mg total) by mouth 2 (two) times daily. 05/27/15   Megan Morris, DO  ferrous sulfate 325 (65 FE) MG tablet Take 1 tablet (325 mg total) by mouth 2 (two) times daily with a meal. 05/27/15   Megan Morris, DO  ibuprofen (ADVIL,MOTRIN) 800 MG tablet Take 1 tablet (800 mg total) by mouth every 6 (six) hours as needed. 05/27/15   Megan Morris, DO  oxyCODONE-acetaminophen (PERCOCET/ROXICET) 5-325 MG per tablet Take 1-2 tablets by mouth every 4 (four) hours as needed (moderate to severe pain (when tolerating fluids)). 05/27/15   Linda Hedges, DO  oxyCODONE-acetaminophen (PERCOCET/ROXICET) 5-325 MG tablet  Take 1-2 tablets by mouth every 4 (four) hours as needed (moderate to severe pain (when tolerating fluids)). 06/17/15   Linda Hedges, DO     Family History  Problem Relation Age of Onset  . Anesthesia problems Neg Hx   . Cancer Mother     breast  . Diabetes Mother   . Hypertension Father   . Hypertension Paternal Aunt   . Stroke Maternal Grandmother   .  Diabetes Maternal Grandmother     Social History   Social History  . Marital Status: Divorced    Spouse Name: N/A  . Number of Children: N/A  . Years of Education: N/A   Social History Main Topics  . Smoking status: Current Some Day Smoker -- 0.00 packs/day for 5 years    Types: Cigars  . Smokeless tobacco: Never Used     Comment: hx smoking on and off--  currently cigar's 1pp2wk occasional  . Alcohol Use: 4.2 oz/week    0 Standard drinks or equivalent, 7 Glasses of wine per week     Comment: daily wine  . Drug Use: No  . Sexual Activity: Yes    Birth Control/ Protection: Implant     Comment: nexplanon implant   Other Topics Concern  . Not on file   Social History Narrative     Vital Signs: BP 140/57 mmHg  Pulse 93  Temp(Src) 98 F (36.7 C) (Oral)  SpO2 99%  LMP 05/25/2015  Physical Exam patient is awake and alert, right lower quadrant drain intact, insertion site okay, mildly tender to palpation. Drain bag with small amount of blood tinged serous fluid. Abdomen soft, positive bowel sounds.  Imaging: No results found.  Labs:  CBC:  Recent Labs  06/13/15 1303 06/14/15 0515 06/15/15 0600 06/16/15 0530  WBC 13.1* 11.3* 12.3* 12.5*  HGB 9.9* 8.5* 8.7* 8.3*  HCT 30.0* 26.4* 26.7* 26.5*  PLT 346 319 319 346    COAGS:  Recent Labs  06/14/15 0745  INR 1.47    BMP:  Recent Labs  05/20/15 0759 05/26/15 0600 06/13/15 1303 06/14/15 0515  NA 136 138 140 139  K 4.0 4.0 3.1* 3.1*  CL 107 105 103 103  CO2 23 23 28 29   GLUCOSE 152* 137* 91 96  BUN 8 13 6  <5*  CALCIUM 8.7* 8.5* 8.5* 7.7*  CREATININE 0.71 0.86 0.70 0.61  GFRNONAA >60 >60 >60 >60  GFRAA >60 >60 >60 >60    LIVER FUNCTION TESTS:  Recent Labs  05/20/15 0759 05/26/15 0600 06/13/15 1303 06/14/15 0515  BILITOT 0.4 0.4 0.9 0.8  AST 21 17 13* 9*  ALT 12* 10* 13* 10*  ALKPHOS 91 57 92 76  PROT 7.3 5.7* 7.2 6.1*  ALBUMIN 3.7 3.1* 3.2* 2.5*    Assessment: DESHAWN SKELLEY is a  39 y.o. female who is status post LAVH/bilateral salpingectomy on 05/25/15 secondary to menorrhagia and subsequent finding of central pelvic abscess postop. Status post CT-guided drainage of central pelvic fluid collection on 06/14/15. Abscess fluid cultures grew Streptococcus and patient has been on antibiotic therapy with last dose on 06/23/15. She is currently stable and denies any recent fevers ,chills or worsening abdominal pain. Follow-up CT today reveals decreased but residual fluid around drainage catheter. Drainage catheter was irrigated with normal saline without difficulty and placed to JP bulb suction. A new gauze dressing and adhesive StatLock were applied to drain. At this time recommend continued drainage of abscess with twice a day normal saline flushes, recording  of output and dressing changes as needed. Patient will return to IR clinic in 2 weeks for follow-up CT pelvis with IV contrast. Catheter care instructions given to patient. She was told to contact our service with any questions in the interim.   Signed: D. Rowe Robert 06/24/2015, 11:59 AM   Please refer to Dr. Annamaria Boots attestation of this note for management and plan.

## 2015-07-08 ENCOUNTER — Ambulatory Visit
Admission: RE | Admit: 2015-07-08 | Discharge: 2015-07-08 | Disposition: A | Discharge status: Home / Self Care | Payer: Commercial Managed Care - PPO | Source: Ambulatory Visit | Attending: Obstetrics and Gynecology | Admitting: Obstetrics and Gynecology

## 2015-07-08 ENCOUNTER — Ambulatory Visit: Admission: RE | Admit: 2015-07-08 | Payer: Commercial Managed Care - PPO | Source: Ambulatory Visit

## 2015-07-08 DIAGNOSIS — K651 Peritoneal abscess: Principal | ICD-10-CM

## 2015-07-08 DIAGNOSIS — IMO0001 Reserved for inherently not codable concepts without codable children: Secondary | ICD-10-CM

## 2015-07-08 DIAGNOSIS — T814XXD Infection following a procedure, subsequent encounter: Principal | ICD-10-CM

## 2015-07-08 DIAGNOSIS — T8149XA Infection following a procedure, other surgical site, initial encounter: Secondary | ICD-10-CM

## 2015-07-08 DIAGNOSIS — T8143XA Infection following a procedure, organ and space surgical site, initial encounter: Secondary | ICD-10-CM | POA: Insufficient documentation

## 2015-07-08 MED ORDER — IOPAMIDOL (ISOVUE-300) INJECTION 61%
125.0000 mL | Freq: Once | INTRAVENOUS | Status: AC | PRN
  Administered 2015-07-08: 125 mL via INTRAVENOUS

## 2015-07-08 NOTE — Progress Notes (Signed)
Referring Physician(s): Tomblin,James  Chief Complaint: The patient is seen in follow up today s/p 06/14/15 Technically successful CT-guided pelvic abscess drain catheter placement.   History of present illness:  CLINICAL DATA: Recent hysterectomy. Intermittent chills. CT demonstrates peripherally enhancing pelvic fluid collection abcsess drain placed 06/14/15 First outpatient  follow up CT 10/6: Interval decrease in size of the pelvic abscess compared to 06/17/2015. Stable drain catheter position. No new finding Drain was placed to suction bulb and new follow up in 2 weeks Now back in clinic for CT and possible removal of drain Pt states output is minimal to none Flushing daily Afeb; no complaints No pain; no fever; chills   Past Medical History  Diagnosis Date  . HSV-2 (herpes simplex virus 2) infection 2007    currently taking meds.  . History of gastritis     1998  . History of abnormal cervical Pap smear   . History of gestational diabetes   . History of small bowel obstruction     05-11-2013 resolved without surgical intervention  . Heavy menstrual bleeding   . GERD (gastroesophageal reflux disease)   . History of cardiac murmur     only during one pregancy--     Past Surgical History  Procedure Laterality Date  . Dilation and evacuation  x3   last one 2011  . Laparoscopic assisted vaginal hysterectomy N/A 05/25/2015    Procedure: LAPAROSCOPIC ASSISTED VAGINAL HYSTERECTOMY;  Surgeon: Linda Hedges, DO;  Location: [place];  Service: Gynecology;  Laterality: N/A;  . Bilateral salpingectomy Bilateral 05/25/2015    Procedure: BILATERAL SALPINGECTOMY;  Surgeon: Linda Hedges, DO;  Location: [place];  Service: Gynecology;  Laterality: Bilateral;  . Iud removal Left 05/25/2015    Procedure: NEXPLANON REMOVAL LEFT UPPER ARM;  Surgeon: Linda Hedges, DO;  Location: [place];  Service: Gynecology;  Laterality: Left;     Allergies: Adhesive  Medications: Prior to Admission medications   Medication Sig Start Date End Date Taking? Authorizing Provider  acyclovir (ZOVIRAX) 800 MG tablet Take 800 mg by mouth daily.      Historical Provider, MD  amoxicillin-clavulanate (AUGMENTIN) 875-125 MG tablet Take 1 tablet by mouth every 12 (twelve) hours. 06/17/15   Megan Morris, DO  docusate sodium (COLACE) 100 MG capsule Take 1 capsule (100 mg total) by mouth 2 (two) times daily. 05/27/15   Megan Morris, DO  ferrous sulfate 325 (65 FE) MG tablet Take 1 tablet (325 mg total) by mouth 2 (two) times daily with a meal. 05/27/15   Megan Morris, DO  ibuprofen (ADVIL,MOTRIN) 800 MG tablet Take 1 tablet (800 mg total) by mouth every 6 (six) hours as needed. 05/27/15   Megan Morris, DO  oxyCODONE-acetaminophen (PERCOCET/ROXICET) 5-325 MG per tablet Take 1-2 tablets by mouth every 4 (four) hours as needed (moderate to severe pain (when tolerating fluids)). 05/27/15   Megan Morris, DO  oxyCODONE-acetaminophen (PERCOCET/ROXICET) 5-325 MG tablet Take 1-2 tablets by mouth every 4 (four) hours as needed (moderate to severe pain (when tolerating fluids)). 06/17/15   Linda Hedges, DO     Family History  Problem Relation Age of Onset  . Anesthesia problems Neg Hx   . Cancer Mother     breast  . Diabetes Mother   . Hypertension Father   . Hypertension Paternal Aunt   . Stroke Maternal Grandmother   . Diabetes Maternal Grandmother     Social History   Social History  . Marital Status: Divorced  Spouse Name: N/A  . Number of Children: N/A  . Years of Education: N/A   Social History Main Topics  . Smoking status: Current Some Day Smoker -- 0.00 packs/day for 5 years    Types: Cigars  . Smokeless tobacco: Never Used     Comment: hx smoking on and off--  currently cigar's 1pp2wk occasional  . Alcohol Use: 4.2 oz/week    0 Standard drinks or equivalent, 7 Glasses of wine per week     Comment: daily wine  . Drug Use: No  . Sexual  Activity: Yes    Birth Control/ Protection: Implant     Comment: nexplanon implant   Other Topics Concern  . Not on file   Social History Narrative     Vital Signs: BP 129/70 mmHg  Pulse 83  Temp(Src) 98 F (36.7 C) (Oral)  SpO2 100%  LMP 05/25/2015  Physical Exam  Abdominal: Soft. Bowel sounds are normal. There is tenderness.  Site of drain is without sign of infection NT; no bleeding Output minimal CT was read as resolved abscess Drain removed without complication  Nursing note and vitals reviewed.   Imaging: Ct Pelvis W Contrast  07/08/2015  CLINICAL DATA:  Status post percutaneous drainage of pelvic abscess on 06/14/2015 which developed after hysterectomy. EXAM: CT PELVIS WITH CONTRAST TECHNIQUE: Multidetector CT imaging of the pelvis was performed using the standard protocol following the bolus administration of intravenous contrast. CONTRAST:  110mL ISOVUE-300 IOPAMIDOL (ISOVUE-300) INJECTION 61% COMPARISON:  06/24/2015 FINDINGS: The central pelvic abscess has completely resolved with no further abscess cavity seen around the drainage catheter. The catheter is stable in position. A right adnexal cyst appears slightly larger in size compared to the prior CT, measuring approximately 3.1 cm. This remains likely consistent with a physiologic cyst. Pelvic bowel loops are normal. The bladder is unremarkable. No hernias, masses or lymphadenopathy identified. Bony structures are unremarkable. IMPRESSION: Resolution of pelvic abscess. Clinically there is no significant output from the catheter. The drain was removed. Please see separate Epic progress note. Electronically Signed   By: Aletta Edouard M.D.   On: 07/08/2015 10:58    Labs:  CBC:  Recent Labs  06/13/15 1303 06/14/15 0515 06/15/15 0600 06/16/15 0530  WBC 13.1* 11.3* 12.3* 12.5*  HGB 9.9* 8.5* 8.7* 8.3*  HCT 30.0* 26.4* 26.7* 26.5*  PLT 346 319 319 346    COAGS:  Recent Labs  06/14/15 0745  INR 1.47     BMP:  Recent Labs  05/20/15 0759 05/26/15 0600 06/13/15 1303 06/14/15 0515  NA 136 138 140 139  K 4.0 4.0 3.1* 3.1*  CL 107 105 103 103  CO2 23 23 28 29   GLUCOSE 152* 137* 91 96  BUN 8 13 6  <5*  CALCIUM 8.7* 8.5* 8.5* 7.7*  CREATININE 0.71 0.86 0.70 0.61  GFRNONAA >60 >60 >60 >60  GFRAA >60 >60 >60 >60    LIVER FUNCTION TESTS:  Recent Labs  05/20/15 0759 05/26/15 0600 06/13/15 1303 06/14/15 0515  BILITOT 0.4 0.4 0.9 0.8  AST 21 17 13* 9*  ALT 12* 10* 13* 10*  ALKPHOS 91 57 92 76  PROT 7.3 5.7* 7.2 6.1*  ALBUMIN 3.7 3.1* 3.2* 2.5*    Assessment:  Pelvic abscess drain placed 9/26 Doing well CT today shows resolution of abscess Drain removal without complication  Signed: Elysse Polidore A 07/08/2015, 11:07 AM   Please refer to Dr. Dr Kathlene Cote attestation of this note for management and plan.

## 2016-11-16 DIAGNOSIS — D496 Neoplasm of unspecified behavior of brain: Secondary | ICD-10-CM | POA: Insufficient documentation

## 2017-12-05 ENCOUNTER — Other Ambulatory Visit: Payer: Self-pay | Admitting: Otolaryngology

## 2017-12-05 DIAGNOSIS — IMO0001 Reserved for inherently not codable concepts without codable children: Secondary | ICD-10-CM

## 2017-12-05 DIAGNOSIS — H9041 Sensorineural hearing loss, unilateral, right ear, with unrestricted hearing on the contralateral side: Secondary | ICD-10-CM

## 2017-12-09 ENCOUNTER — Ambulatory Visit
Admission: RE | Admit: 2017-12-09 | Discharge: 2017-12-09 | Disposition: A | Discharge status: Home / Self Care | Payer: BLUE CROSS/BLUE SHIELD | Source: Ambulatory Visit | Attending: Otolaryngology | Admitting: Otolaryngology

## 2017-12-09 DIAGNOSIS — H9041 Sensorineural hearing loss, unilateral, right ear, with unrestricted hearing on the contralateral side: Secondary | ICD-10-CM

## 2017-12-09 DIAGNOSIS — IMO0001 Reserved for inherently not codable concepts without codable children: Secondary | ICD-10-CM

## 2017-12-09 MED ORDER — GADOBENATE DIMEGLUMINE 529 MG/ML IV SOLN
20.0000 mL | Freq: Once | INTRAVENOUS | Status: AC | PRN
  Administered 2017-12-09: 20 mL via INTRAVENOUS

## 2017-12-10 ENCOUNTER — Other Ambulatory Visit: Payer: Self-pay | Admitting: Otolaryngology

## 2017-12-10 DIAGNOSIS — D447 Neoplasm of uncertain behavior of aortic body and other paraganglia: Secondary | ICD-10-CM

## 2017-12-14 ENCOUNTER — Ambulatory Visit
Admission: RE | Admit: 2017-12-14 | Discharge: 2017-12-14 | Disposition: A | Discharge status: Home / Self Care | Payer: BLUE CROSS/BLUE SHIELD | Source: Ambulatory Visit | Attending: Otolaryngology | Admitting: Otolaryngology

## 2017-12-14 DIAGNOSIS — D447 Neoplasm of uncertain behavior of aortic body and other paraganglia: Secondary | ICD-10-CM

## 2017-12-14 MED ORDER — IOPAMIDOL (ISOVUE-300) INJECTION 61%
75.0000 mL | Freq: Once | INTRAVENOUS | Status: AC | PRN
  Administered 2017-12-14: 75 mL via INTRAVENOUS

## 2017-12-27 DIAGNOSIS — D447 Neoplasm of uncertain behavior of aortic body and other paraganglia: Secondary | ICD-10-CM | POA: Insufficient documentation

## 2018-05-05 ENCOUNTER — Emergency Department (HOSPITAL_COMMUNITY): Payer: Self-pay

## 2018-05-05 ENCOUNTER — Emergency Department (HOSPITAL_COMMUNITY)
Admission: EM | Admit: 2018-05-05 | Discharge: 2018-05-05 | Disposition: A | Discharge status: Home / Self Care | Payer: Self-pay | Attending: Emergency Medicine | Admitting: Emergency Medicine

## 2018-05-05 ENCOUNTER — Other Ambulatory Visit: Payer: Self-pay

## 2018-05-05 ENCOUNTER — Encounter (HOSPITAL_COMMUNITY): Payer: Self-pay | Admitting: Emergency Medicine

## 2018-05-05 DIAGNOSIS — R0789 Other chest pain: Secondary | ICD-10-CM | POA: Insufficient documentation

## 2018-05-05 DIAGNOSIS — F17290 Nicotine dependence, other tobacco product, uncomplicated: Secondary | ICD-10-CM | POA: Insufficient documentation

## 2018-05-05 DIAGNOSIS — M542 Cervicalgia: Secondary | ICD-10-CM

## 2018-05-05 LAB — I-STAT BETA HCG BLOOD, ED (NOT ORDERABLE)

## 2018-05-05 LAB — BASIC METABOLIC PANEL
ANION GAP: 9 (ref 5–15)
BUN: 9 mg/dL (ref 6–20)
CALCIUM: 9 mg/dL (ref 8.9–10.3)
CO2: 26 mmol/L (ref 22–32)
CREATININE: 0.66 mg/dL (ref 0.44–1.00)
Chloride: 106 mmol/L (ref 98–111)
GFR calc non Af Amer: >60 mL/min (ref >60)
Glucose, Bld: 89 mg/dL (ref 70–99)
Potassium: 3.5 mmol/L (ref 3.5–5.1)
SODIUM: 141 mmol/L (ref 135–145)

## 2018-05-05 LAB — CBC
HCT: 38.8 % (ref 36.0–46.0)
HEMOGLOBIN: 12.9 g/dL (ref 12.0–15.0)
MCH: 27.6 pg (ref 26.0–34.0)
MCHC: 33.2 g/dL (ref 30.0–36.0)
MCV: 82.9 fL (ref 78.0–100.0)
PLATELETS: 253 10*3/uL (ref 150–400)
RBC: 4.68 MIL/uL (ref 3.87–5.11)
RDW: 12.6 % (ref 11.5–15.5)
WBC: 8.3 10*3/uL (ref 4.0–10.5)

## 2018-05-05 LAB — POCT I-STAT TROPONIN I: Troponin i, poc: 0 ng/mL (ref 0.00–0.08)

## 2018-05-05 MED ORDER — SODIUM CHLORIDE 0.9 % IV BOLUS
500.0000 mL | Freq: Once | INTRAVENOUS | Status: AC
Start: 2018-05-05 — End: 2018-05-05
  Administered 2018-05-05: 500 mL via INTRAVENOUS

## 2018-05-05 MED ORDER — IOHEXOL 300 MG/ML  SOLN
75.0000 mL | Freq: Once | INTRAMUSCULAR | Status: AC | PRN
  Administered 2018-05-05: 75 mL via INTRAVENOUS

## 2018-05-05 MED ORDER — HYDROCODONE-ACETAMINOPHEN 5-325 MG PO TABS: 1.0000 | ORAL_TABLET | Freq: Four times a day (QID) | ORAL | 0 refills | Status: DC | PRN

## 2018-05-05 MED ORDER — KETOROLAC TROMETHAMINE 30 MG/ML IJ SOLN
15.0000 mg | Freq: Once | INTRAMUSCULAR | Status: AC
  Administered 2018-05-05: 15 mg via INTRAVENOUS
  Filled 2018-05-05: qty 1

## 2018-05-05 NOTE — ED Provider Notes (Signed)
[facility] DEPT Provider Note   CSN: [id number] Arrival date & time: 05/05/18  1448     History   Chief Complaint Chief Complaint  Patient presents with  . Chest Pain    HPI Kristina Castro is a 42 y.o. female.  HPI Patient presents with worsening neck pain, right arm discomfort, and new upper chest pain. 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 She notes that over the past weeks, in particular days she has had increasing pain in the right neck, right upper chest, with new radiation and pain in the right shoulder. There is some new dyspnea, though not consistent. No new pleuritic changes, no new fever, no no vomiting. There is occasional cough. Minimal relief with ibuprofen. She is here with a companion who assists with the HPI. Past Medical History:  Diagnosis Date  . GERD (gastroesophageal reflux disease)   . Heavy menstrual bleeding   . History of abnormal cervical Pap smear   . History of cardiac murmur    only during one pregancy--   . History of gastritis    1998  . History of gestational diabetes   . History of small bowel obstruction    05-11-2013 resolved without surgical intervention  . HSV-2 (herpes simplex virus 2) infection 2007   currently taking meds.    Patient Active Problem List   Diagnosis Date Noted  . Postoperative intra-abdominal abscess   . Pelvic abscess in female   . Pelvic mass in female 06/13/2015  . S/P hysterectomy 05/25/2015  . Abdominal pain 05/11/2013    Past Surgical History:  Procedure Laterality Date  . BILATERAL SALPINGECTOMY Bilateral 05/25/2015   Procedure: BILATERAL SALPINGECTOMY;  Surgeon: Linda Hedges, DO;  Location: [facility];  Service: Gynecology;  Laterality: Bilateral;  . DILATION AND EVACUATION  x3   last one 2011  . IUD REMOVAL Left 05/25/2015   Procedure: NEXPLANON REMOVAL LEFT UPPER  ARM;  Surgeon: Linda Hedges, DO;  Location: [facility];  Service: Gynecology;  Laterality: Left;  . LAPAROSCOPIC ASSISTED VAGINAL HYSTERECTOMY N/A 05/25/2015   Procedure: LAPAROSCOPIC ASSISTED VAGINAL HYSTERECTOMY;  Surgeon: Linda Hedges, DO;  Location: [facility];  Service: Gynecology;  Laterality: N/A;     OB History    Gravida  7   Para  4   Term  4   Preterm  0   AB  3   Living  4     SAB  0   TAB  3   Ectopic  0   Multiple  0   Live Births  4            Home Medications    Prior to Admission medications   Medication Sig Start Date End Date Taking? Authorizing Provider  ibuprofen (ADVIL,MOTRIN) 200 MG tablet Take 400 mg by mouth every 6 (six) hours as needed for moderate pain.   Yes [provider]  amoxicillin-clavulanate (AUGMENTIN) 875-125 MG tablet Take 1 tablet by mouth every 12 (twelve) hours. Patient not taking: Reported on 05/05/2018 06/17/15   Linda Hedges, DO  docusate sodium (COLACE) 100 MG capsule Take 1 capsule (100 mg total) by mouth 2 (two) times daily. Patient not taking: Reported on 05/05/2018 05/27/15   Linda Hedges, DO  ferrous sulfate 325 (65 FE) MG tablet Take 1 tablet (325 mg total) by mouth 2 (two) times daily with a meal. Patient  not taking: Reported on 05/05/2018 05/27/15   Linda Hedges, DO  HYDROcodone-acetaminophen (NORCO/VICODIN) 5-325 MG tablet Take 1 tablet by mouth every 6 (six) hours as needed for severe pain. 05/05/18   Carmin Muskrat, MD  ibuprofen (ADVIL,MOTRIN) 800 MG tablet Take 1 tablet (800 mg total) by mouth every 6 (six) hours as needed. Patient not taking: Reported on 05/05/2018 05/27/15   Linda Hedges, DO  oxyCODONE-acetaminophen (PERCOCET/ROXICET) 5-325 MG per tablet Take 1-2 tablets by mouth every 4 (four) hours as needed (moderate to severe pain (when tolerating fluids)). Patient not taking: Reported on 05/05/2018 05/27/15   Linda Hedges, DO  oxyCODONE-acetaminophen (PERCOCET/ROXICET)  5-325 MG tablet Take 1-2 tablets by mouth every 4 (four) hours as needed (moderate to severe pain (when tolerating fluids)). Patient not taking: Reported on 05/05/2018 06/17/15   Linda Hedges, DO    Family History Family History  Problem Relation Age of Onset  . Cancer Mother        breast  . Diabetes Mother   . Hypertension Father   . Hypertension Paternal Aunt   . Stroke Maternal Grandmother   . Diabetes Maternal Grandmother   . Anesthesia problems Neg Hx     Social History Social History   Tobacco Use  . Smoking status: Current Some Day Smoker    Packs/day: 0.00    Years: 5.00    Pack years: 0.00    Types: Cigars  . Smokeless tobacco: Never Used  . Tobacco comment: hx smoking on and off--  currently cigar's 1pp2wk occasional  Substance Use Topics  . Alcohol use: Yes    Alcohol/week: 7.0 standard drinks    Types: 7 Glasses of wine per week    Comment: daily wine  . Drug use: No     Allergies   Adhesive [tape] and Gadolinium derivatives   Review of Systems Review of Systems  Constitutional:       Per HPI, otherwise negative  HENT:       Per HPI, otherwise negative  Respiratory:       Per HPI, otherwise negative  Cardiovascular:       Per HPI, otherwise negative  Gastrointestinal: Negative for vomiting.  Endocrine:       Negative aside from HPI  Genitourinary:       Neg aside from HPI   Musculoskeletal:       Per HPI, otherwise negative  Skin: Negative.   Neurological: Negative for syncope.     Physical Exam Updated Vital Signs BP (!) 148/88   Pulse 82   Temp 97.7 F (36.5 C)   Resp 16   LMP 05/25/2015   SpO2 99%   Physical Exam  Constitutional: She is oriented to person, place, and time. She appears well-developed and well-nourished. No distress.  HENT:  Head: Normocephalic and atraumatic.  Eyes: Conjunctivae and EOM are normal.  Neck:    Cardiovascular: Normal rate and regular rhythm.  Pulmonary/Chest: Effort normal and breath sounds  normal. No stridor. No respiratory distress.  Abdominal: She exhibits no distension.  Musculoskeletal: She exhibits no edema.       Arms: Neurological: She is alert and oriented to person, place, and time. No cranial nerve deficit.  Skin: Skin is warm and dry.  Psychiatric: She has a normal mood and affect.  Nursing note and vitals reviewed.    ED Treatments / Results  Labs (all labs ordered are listed, but only abnormal results are displayed) Labs Reviewed  BASIC METABOLIC PANEL  CBC  I-STAT  TROPONIN, ED  I-STAT BETA HCG BLOOD, ED ([facility], [facility], [facility] ONLY)  POCT I-STAT TROPONIN I  I-STAT BETA HCG BLOOD, ED (NOT ORDERABLE)    EKG EKG Interpretation  Date/Time:  Sunday May 05 2018 15:01:53 EDT Ventricular Rate:  84 PR Interval:    QRS Duration: 89 QT Interval:  342 QTC Calculation: 405 R Axis:   30 Text Interpretation:  Sinus rhythm Normal ECG Confirmed by Carmin Muskrat [phone]) on 05/05/2018 3:36:46 PM Also confirmed by Carmin Muskrat ([id number]), editor Philomena Doheny [phone])  on 05/05/2018 4:54:06 PM   Radiology Dg Chest 2 View  Result Date: 05/05/2018 CLINICAL DATA:  She c/o right-sided chest pain radiating into right arm x 3 weeks. She states she underwent a one-time irradiation to right head for a "benign tumor" almost two months ago. H/o cardiac murmur. Smoker. EXAM: CHEST - 2 VIEW COMPARISON:  08/28/2010 FINDINGS: Lateral view degraded by patient arm position. Numerous leads and wires project over the chest. Midline trachea. Normal heart size and mediastinal contours. No pleural effusion or pneumothorax. Clear lungs. IMPRESSION: No acute cardiopulmonary disease. Electronically Signed   By: Abigail Miyamoto M.D.   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 EXAM: CT NECK WITH CONTRAST TECHNIQUE:  Multidetector CT imaging of the neck was performed using the standard protocol following the bolus administration of intravenous contrast. CONTRAST:  10mL OMNIPAQUE IOHEXOL 300 MG/ML  SOLN COMPARISON:  Temporal bone CT 12/14/2017, brain MRI 12/09/2017. FINDINGS: Pharynx and larynx: Mild asymmetry of the pharynx with medial deviation of the right false cord. No laryngeal mass. Pharyngeal soft tissue contours are within normal limits. Negative parapharyngeal and retropharyngeal spaces. Salivary glands: Negative sublingual space. Submandibular glands are symmetric and within normal limits. Parotid glands are symmetric and within normal limits. Thyroid: Generalized thyromegaly. Mild associated regional mass effect, but no narrowing of the trachea. In the anterior superior mediastinum there is a lobulated 22 x 26 by 22 millimeter soft tissue mass (AP by transverse by CC) which appears inseparable from the left thyroid lower pole on sagittal image 54 and is probably retrosternal extension of goiter. Lymph nodes: Maximal to mildly enlarged bilateral level 2A lymph nodes measure up to 12 millimeters diameter (series 2, image 45). No necrotic or heterogeneous lymph nodes. The level 2 B and other nodal stations in the bilateral neck appear within normal limits. Vascular: The major vascular structures in the neck remain patent including the right IJ, although there are large anterior thyroidal vein collaterals. At the right skull base masslike enhancement persists in an area of about 2 centimeters corresponding to the known vascular paraganglioma (series 2, image 17). Inside the skull both sagittal dural venous sinuses appear patent. Limited intracranial: Intracranial extra-axial component of the paraganglioma redemonstrated at the level of the right jugular foramen and hypoglossal canal (series 2, image 10). This appears stable compared to 12/09/2017. Otherwise negative visible brain parenchyma. Visualized orbits: Negative.  Mastoids and visualized paranasal sinuses: Stable and well pneumatized, chronic small right maxillary sinus mucous retention cysts. The right tympanic cavity and mastoids remain clear. Skeleton: Chronic erosion of the right jugular foramen, although with a fairly well-circumscribed appearance (series 3, image 13) is stable since 12/14/2017. No new No acute osseous abnormality identified. Upper chest: Left anterior superior mediastinal mass. As described in the thyroid section above no superior mediastinal lymphadenopathy.  Partially visible and questionable central pulmonary artery enlargement. Negative lung apices. Normal visible axillary lymph nodes. IMPRESSION: 1. 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 2. 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 3. 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 4. Maximal to mildly enlarged bilateral level 2A lymph nodes are nonspecific, with no other lymphadenopathy or or abnormality identified. Electronically Signed   By: Genevie Ann M.D.   On: 05/05/2018 17:48    Procedures Procedures (including critical care time)  Medications Ordered in ED Medications  sodium chloride 0.9 % bolus 500 mL (0 mLs Intravenous Stopped 05/05/18 1739)  ketorolac (TORADOL) 30 MG/ML injection 15 mg (15 mg Intravenous Given 05/05/18 1640)  iohexol (OMNIPAQUE) 300 MG/ML solution 75 mL (75 mLs Intravenous Contrast Given 05/05/18 1716)     Initial Impression / Assessment and Plan / ED Course  I have reviewed the triage vital signs and the nursing notes.  Pertinent labs & imaging results that were available during my care of the patient were reviewed by me and considered in my medical decision making (see chart for details).    After the initial evaluation I reviewed the patient's chart including documentation from her local, and neurosurgery, oncology at  another hospital, [facility]. Subsequently discussed imaging modalities with our radiology team to ensure appropriate studies.   6:25 PM Patient in no distress, hemodynamically the same as on arrival. Some pain improvement, though not complete resolution per Discussed all findings including reassuring CT scan, x-ray, labs, EKG, no evidence for ACS, pneumonia.  With patient not hypoxic, tachypnea, tachycardia, and with no evidence for persistent disease, low suspicion for PE. Some suspicion for changes secondary to radiation exposure. Patient was started on new topical and oral medication, will follow up with her care team.   Final Clinical Impressions(s) / ED Diagnoses   Final diagnoses:  Atypical chest pain  Neck pain    ED Discharge Orders         Ordered    HYDROcodone-acetaminophen (NORCO/VICODIN) 5-325 MG tablet  Every 6 hours PRN     05/05/18 1820        Dimethyl salicylate topical   Carmin Muskrat, MD 05/05/18 1826

## 2018-05-05 NOTE — Discharge Instructions (Signed)
As discussed, today's evaluation has been generally reassuring. However, it is important, with your ongoing pain, that you follow-up with your team of physicians for further monitoring and management.  In addition to the prescribed pain medication, please obtain topical patches containing lidocaine and methyl salicylate, one brand is [employer], to achieve additional pain control.  Return here for concerning changes in your condition.

## 2018-05-05 NOTE — ED Triage Notes (Signed)
She c/o right-sided chest pain radiating into right arm x 3 weeks. She states she underwent a one-time irradiation to right head for a "benign tumor" almost two months ago. She is in no distress. Labs/cxr and blood work performed at triage.

## 2018-06-10 ENCOUNTER — Ambulatory Visit (INDEPENDENT_AMBULATORY_CARE_PROVIDER_SITE_OTHER): Payer: Commercial Managed Care - PPO | Admitting: Physician Assistant

## 2018-06-17 ENCOUNTER — Ambulatory Visit (INDEPENDENT_AMBULATORY_CARE_PROVIDER_SITE_OTHER): Payer: Self-pay | Admitting: Physician Assistant

## 2018-06-17 ENCOUNTER — Ambulatory Visit (INDEPENDENT_AMBULATORY_CARE_PROVIDER_SITE_OTHER): Payer: Self-pay

## 2018-06-17 ENCOUNTER — Encounter (INDEPENDENT_AMBULATORY_CARE_PROVIDER_SITE_OTHER): Payer: Self-pay | Admitting: Physician Assistant

## 2018-06-17 DIAGNOSIS — M542 Cervicalgia: Secondary | ICD-10-CM

## 2018-06-17 DIAGNOSIS — M25511 Pain in right shoulder: Secondary | ICD-10-CM | POA: Insufficient documentation

## 2018-06-17 NOTE — Progress Notes (Signed)
Office Visit Note   Patient: Kristina Castro           Date of Birth: 12-07-1975           MRN: [id number] Visit Date: 06/17/2018              Requested by: Fanny Bien, [address] [place], [place] [address] PCP: Fanny Bien, MD   Assessment & Plan: Visit Diagnoses:  1. Cervicalgia   2. Acute pain of right shoulder     Plan: She is given handouts on both shoulder and neck exercises that she can perform on her own.  We will see her back in 2 weeks to see what type of response she had to the shoulder injection.  I do not feel that all of her pain will resolve as I explained to her today I feel that significant amount of this is coming from her neck.  This may need work-up in the future possibly an MRI.  Questions encouraged and answered at length.  Follow-Up Instructions: Return in about 2 weeks (around 07/01/2018).   Orders:  Orders Placed This Encounter  Procedures  . XR Shoulder Right  . XR Cervical Spine 2 or 3 views   No orders of the defined types were placed in this encounter.     Procedures: No procedures performed   Clinical Data: No additional findings.   Subjective: Chief Complaint  Patient presents with  . Right Shoulder - Pain    HPI Kristina Castro is a 42 year old female comes in today with right shoulder pain.  She is referred from her radiation oncologist for right shoulder pain.  She states that she developed right shoulder pain and pain down the right arm this past June after radiation treatments for a Glomus tumor .  She had no neck pain or shoulder pain prior to that.  States that the pain now radiates from her neck down into the right hand.  Pain does awaken her at its constant.  She states it feels as if her arm is asleep at times.  She does note that she has decreased range of motion of the right shoulder.  She had no known injury.  No fevers chills. Review of Systems Please see HPI otherwise negative  Objective: Vital  Signs: LMP 05/25/2015   Physical Exam  Constitutional: She is oriented to person, place, and time. She appears well-developed and well-nourished. No distress.  Pulmonary/Chest: Effort normal.  Neurological: She is alert and oriented to person, place, and time.  Skin: She is not diaphoretic.  Psychiatric: She has a normal mood and affect.    Ortho Exam Cervical spine full range of motion without pain.  Negative Spurling's.  Tenderness along medial border of the right scapula no tenderness left scapular medial border.  5 out of 5 strength with external and internal rotation against resistance bilateral shoulders.  Empty can test negative bilaterally but painful on the right.  Positive impingement on the right.  She is tenderness about the right shoulder girdle.  Sensation grossly intact bilateral hands full motor bilateral hands.  Tinel's at the wrist causes pain to radiate up the right arm.  Left Tinel's negative. Specialty Comments:  No specialty comments available.  Imaging: Xr Cervical Spine 2 Or 3 Views  Result Date: 06/17/2018 Cervical spine 2 views: No acute fracture disc space all well-maintained.  Slight grade 1 spinal listhesis C3 on C4.  Loss of normal lordotic curvature.  Xr  Shoulder Right  Result Date: 06/17/2018 Right shoulder 3 views: Subacromial space well maintained.  Glenohumeral joint is well-maintained no acute fractures bony abnormalities.  AC joint is well-maintained.  Shoulders well located.    PMFS History: Patient Active Problem List   Diagnosis Date Noted  . Cervicalgia 06/17/2018  . Acute pain of right shoulder 06/17/2018  . Postoperative intra-abdominal abscess   . Pelvic abscess in female   . Pelvic mass in female 06/13/2015  . S/P hysterectomy 05/25/2015  . Abdominal pain 05/11/2013   Past Medical History:  Diagnosis Date  . GERD (gastroesophageal reflux disease)   . Heavy menstrual bleeding   . History of abnormal cervical Pap smear   . History  of cardiac murmur    only during one pregancy--   . History of gastritis    1998  . History of gestational diabetes   . History of small bowel obstruction    05-11-2013 resolved without surgical intervention  . HSV-2 (herpes simplex virus 2) infection 2007   currently taking meds.    Family History  Problem Relation Age of Onset  . Cancer Mother        breast  . Diabetes Mother   . Hypertension Father   . Hypertension Paternal Aunt   . Stroke Maternal Grandmother   . Diabetes Maternal Grandmother   . Anesthesia problems Neg Hx     Past Surgical History:  Procedure Laterality Date  . BILATERAL SALPINGECTOMY Bilateral 05/25/2015   Procedure: BILATERAL SALPINGECTOMY;  Surgeon: Linda Hedges, DO;  Location: [facility];  Service: Gynecology;  Laterality: Bilateral;  . DILATION AND EVACUATION  x3   last one 2011  . IUD REMOVAL Left 05/25/2015   Procedure: NEXPLANON REMOVAL LEFT UPPER ARM;  Surgeon: Linda Hedges, DO;  Location: [facility];  Service: Gynecology;  Laterality: Left;  . LAPAROSCOPIC ASSISTED VAGINAL HYSTERECTOMY N/A 05/25/2015   Procedure: LAPAROSCOPIC ASSISTED VAGINAL HYSTERECTOMY;  Surgeon: Linda Hedges, DO;  Location: [facility];  Service: Gynecology;  Laterality: N/A;   Social History   Occupational History  . Not on file  Tobacco Use  . Smoking status: Current Some Day Smoker    Packs/day: 0.00    Years: 5.00    Pack years: 0.00    Types: Cigars  . Smokeless tobacco: Never Used  . Tobacco comment: hx smoking on and off--  currently cigar's 1pp2wk occasional  Substance and Sexual Activity  . Alcohol use: Yes    Alcohol/week: 7.0 standard drinks    Types: 7 Glasses of wine per week    Comment: daily wine  . Drug use: No  . Sexual activity: Yes    Birth control/protection: Implant    Comment: nexplanon implant

## 2018-07-01 ENCOUNTER — Ambulatory Visit (INDEPENDENT_AMBULATORY_CARE_PROVIDER_SITE_OTHER): Payer: Self-pay | Admitting: Physician Assistant

## 2018-07-10 ENCOUNTER — Ambulatory Visit (INDEPENDENT_AMBULATORY_CARE_PROVIDER_SITE_OTHER): Payer: Self-pay | Admitting: Physician Assistant

## 2018-07-10 ENCOUNTER — Encounter (INDEPENDENT_AMBULATORY_CARE_PROVIDER_SITE_OTHER): Payer: Self-pay | Admitting: Physician Assistant

## 2018-07-10 VITALS — Ht 65.0 in | Wt 203.0 lb

## 2018-07-10 DIAGNOSIS — M25511 Pain in right shoulder: Secondary | ICD-10-CM

## 2018-07-10 DIAGNOSIS — M542 Cervicalgia: Secondary | ICD-10-CM

## 2018-07-10 NOTE — Progress Notes (Signed)
Office Visit Note   Patient: Kristina Castro           Date of Birth: 07-16-1976           MRN: [id number] Visit Date: 07/10/2018              Requested by: Fanny Bien, [address] [place], [place] [address] PCP: Fanny Bien, MD   Assessment & Plan: Visit Diagnoses:  1. Cervicalgia   2. Acute pain of right shoulder     Plan: She is given Thera-Band for shoulder additional exercises to perform these in the therapy and other exercises to perform.  Handouts were given on each of these exercises.  In regards to her paresthesias now on the right arm will obtain an MRI of her cervical spine to rule out HNP as the source of this.  See her back after the MRI to go over results and discuss further treatment.  Questions were encouraged and answered at length.  Follow-Up Instructions: Return for after MRI.   Orders:  No orders of the defined types were placed in this encounter.  No orders of the defined types were placed in this encounter.     Procedures: No procedures performed   Clinical Data: No additional findings.   Subjective: Chief Complaint  Patient presents with  . Neck - Follow-up  . Right Shoulder - Follow-up    HPI [name] returns today follow-up of her right shoulder pain and radicular symptoms down the right arm.  She denies any neck pain.  She continues to have right shoulder pain.  States all of her pain in her shoulder definitely resolved for couple days after the subacromial injection slowly return but is much better than it was at least 50%.  She continues to have numbness down her right arm into her hand states she feels like her arm is asleep at all times.  Is been taking ibuprofen gives her some relief from shoulder pain.  She is been doing her shoulder exercises as shown last visit.  Review of Systems Please see HPI otherwise negative  Objective: Vital Signs: Ht 5\' 5"  (1.651 m)   Wt 203 lb (92.1 kg)   LMP 05/25/2015   BMI  33.78 kg/m   Physical Exam  Constitutional: She is oriented to person, place, and time. She appears well-developed and well-nourished. No distress.  Pulmonary/Chest: Effort normal.  Neurological: She is alert and oriented to person, place, and time.  Skin: She is not diaphoretic.  Psychiatric: She has a normal mood and affect.    Ortho Exam Bilateral shoulders she has 5 out of 5 strength external and internal rotation against resistance.  Mild discomfort with impingement testing right shoulder.  Empty can test negative bilaterally.  Positive liftoff test on the right negative on the left.  Tenderness along medial border of the right scapula no tenderness medial border left scapula.  Cervical spine good range of motion without pain negative Spurling's.  Tenderness at the base of the cervical spinal column with palpation.  Subjective decreased sensation throughout the right hand full sensation in the left hand to light touch.  Full motor bilateral hands. Specialty Comments:  No specialty comments available.  Imaging: No results found.   PMFS History: Patient Active Problem List   Diagnosis Date Noted  . Cervicalgia 06/17/2018  . Acute pain of right shoulder 06/17/2018  . Postoperative intra-abdominal abscess   . Pelvic abscess in female   .  Pelvic mass in female 06/13/2015  . S/P hysterectomy 05/25/2015  . Abdominal pain 05/11/2013   Past Medical History:  Diagnosis Date  . GERD (gastroesophageal reflux disease)   . Heavy menstrual bleeding   . History of abnormal cervical Pap smear   . History of cardiac murmur    only during one pregancy--   . History of gastritis    1998  . History of gestational diabetes   . History of small bowel obstruction    05-11-2013 resolved without surgical intervention  . HSV-2 (herpes simplex virus 2) infection 2007   currently taking meds.    Family History  Problem Relation Age of Onset  . Cancer Mother        breast  . Diabetes Mother    . Hypertension Father   . Hypertension Paternal Aunt   . Stroke Maternal Grandmother   . Diabetes Maternal Grandmother   . Anesthesia problems Neg Hx     Past Surgical History:  Procedure Laterality Date  . BILATERAL SALPINGECTOMY Bilateral 05/25/2015   Procedure: BILATERAL SALPINGECTOMY;  Surgeon: Linda Hedges, DO;  Location: [facility];  Service: Gynecology;  Laterality: Bilateral;  . DILATION AND EVACUATION  x3   last one 2011  . IUD REMOVAL Left 05/25/2015   Procedure: NEXPLANON REMOVAL LEFT UPPER ARM;  Surgeon: Linda Hedges, DO;  Location: [facility];  Service: Gynecology;  Laterality: Left;  . LAPAROSCOPIC ASSISTED VAGINAL HYSTERECTOMY N/A 05/25/2015   Procedure: LAPAROSCOPIC ASSISTED VAGINAL HYSTERECTOMY;  Surgeon: Linda Hedges, DO;  Location: [facility];  Service: Gynecology;  Laterality: N/A;   Social History   Occupational History  . Not on file  Tobacco Use  . Smoking status: Current Some Day Smoker    Packs/day: 0.00    Years: 5.00    Pack years: 0.00    Types: Cigars  . Smokeless tobacco: Never Used  . Tobacco comment: hx smoking on and off--  currently cigar's 1pp2wk occasional  Substance and Sexual Activity  . Alcohol use: Yes    Alcohol/week: 7.0 standard drinks    Types: 7 Glasses of wine per week    Comment: daily wine  . Drug use: No  . Sexual activity: Yes    Birth control/protection: Implant    Comment: nexplanon implant

## 2018-07-11 ENCOUNTER — Other Ambulatory Visit (INDEPENDENT_AMBULATORY_CARE_PROVIDER_SITE_OTHER): Payer: Self-pay

## 2018-07-11 DIAGNOSIS — M4807 Spinal stenosis, lumbosacral region: Secondary | ICD-10-CM

## 2018-08-21 DIAGNOSIS — Z923 Personal history of irradiation: Secondary | ICD-10-CM | POA: Insufficient documentation

## 2019-08-26 IMAGING — CT CT NECK W/ CM
3 of 5 series · 13 of 33 positions shown, 16 images · IV contrast (omnipaque)
Comparison: Temporal bone CT 12/14/2017, brain MRI 12/09/2017.

CLINICAL DATA: 41-year-old female with right facial swelling status
post radiation treatment 2 months ago for paraganglioma, glomus
jugulare tumor.

EXAM:
CT NECK WITH CONTRAST
TECHNIQUE: Multidetector CT imaging of the neck was performed using the
standard protocol following the bolus administration of intravenous
contrast.
CONTRAST:  75mL OMNIPAQUE IOHEXOL 300 MG/ML  SOLN

[Series 4: orthogonal ax · axial · 0.39mm/px · z∈[-143,+18]mm · 5 of 130 slices shown, 7 images]
[im 22/130  soft-tissue]
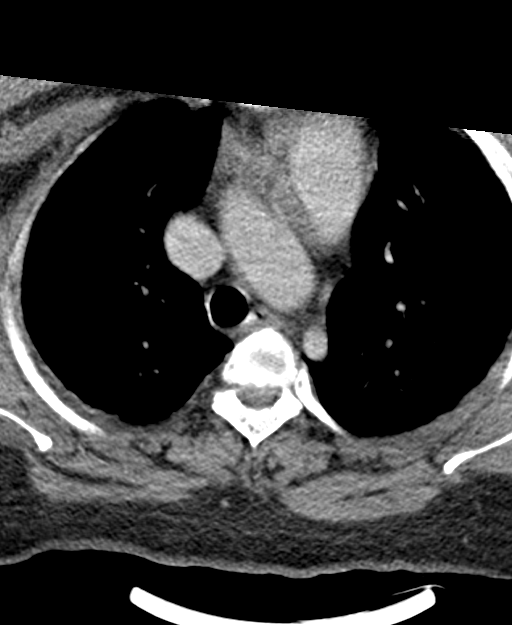
[im 22/130  bone]
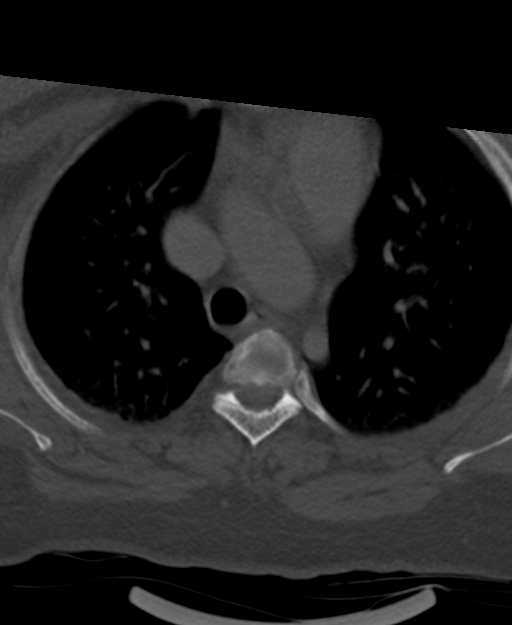
[im 44/130  bone]
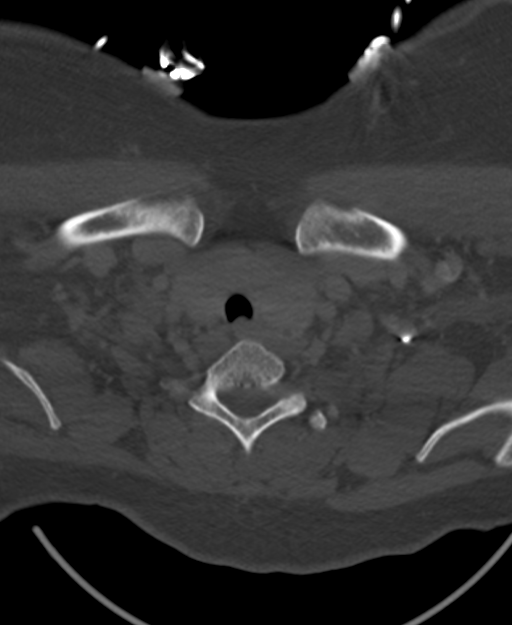
[im 65/130  bone]
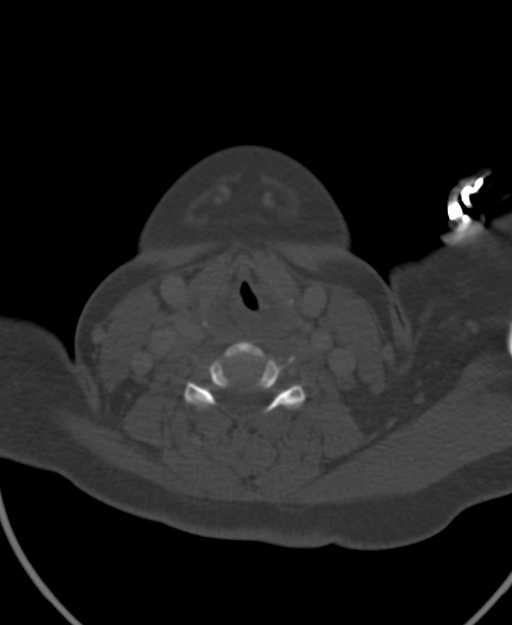
[im 87/130  bone]
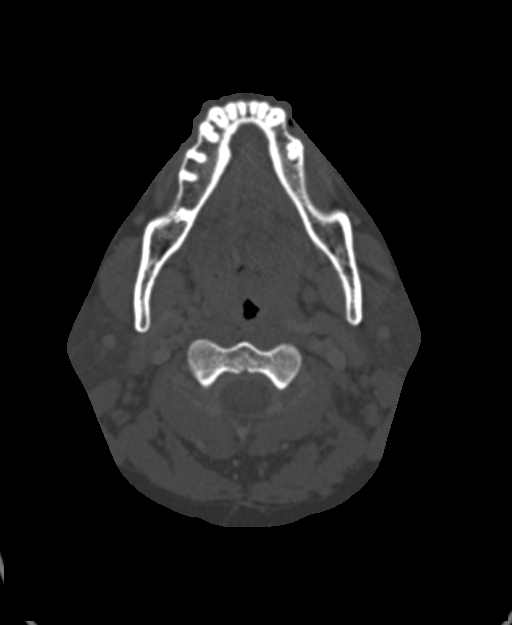
[im 108/130  soft-tissue]
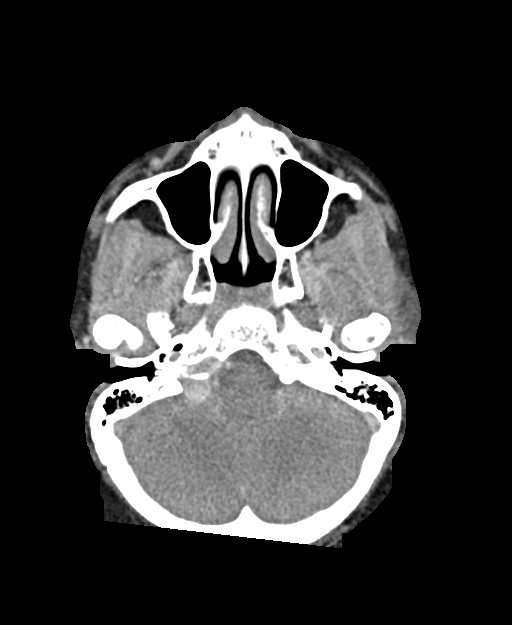
[im 108/130  bone]
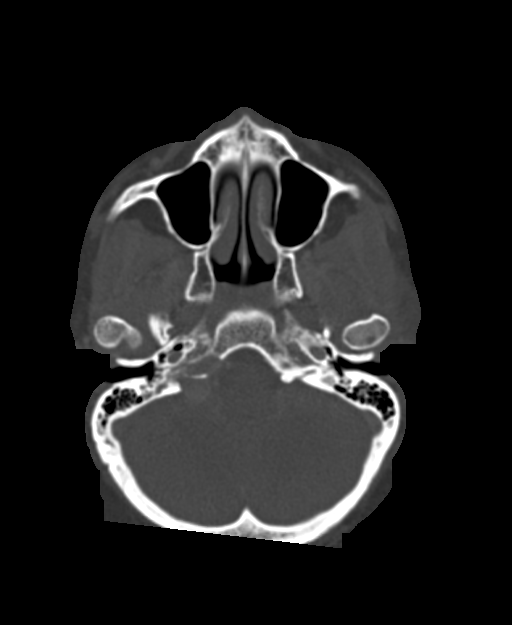

[Series 5: cor neck · coronal · 0.47mm/px · 3 of 101 slices shown]
[im 34/101  bone]
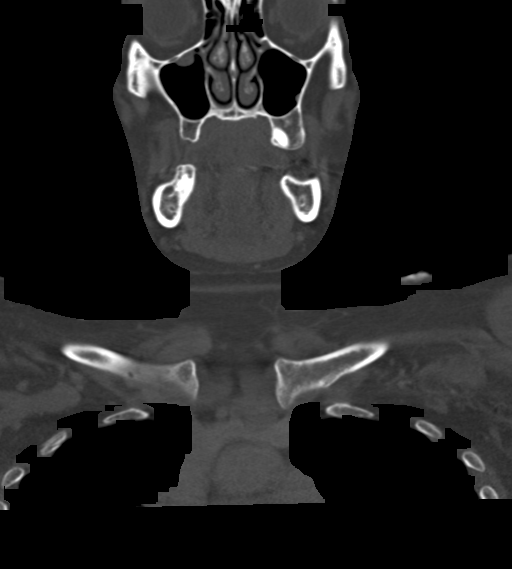
[im 45/101  bone]
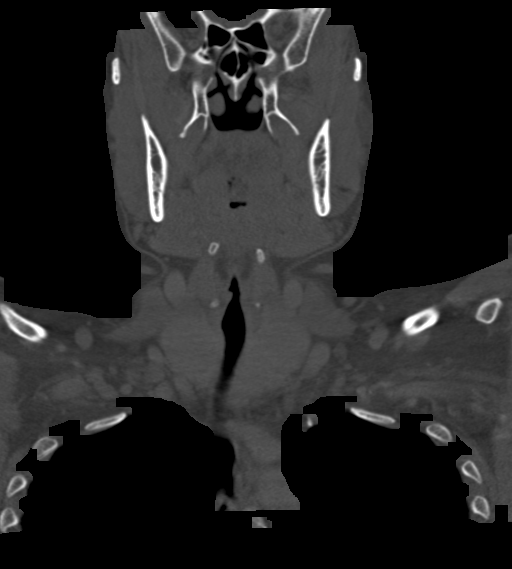
[im 56/101  bone]
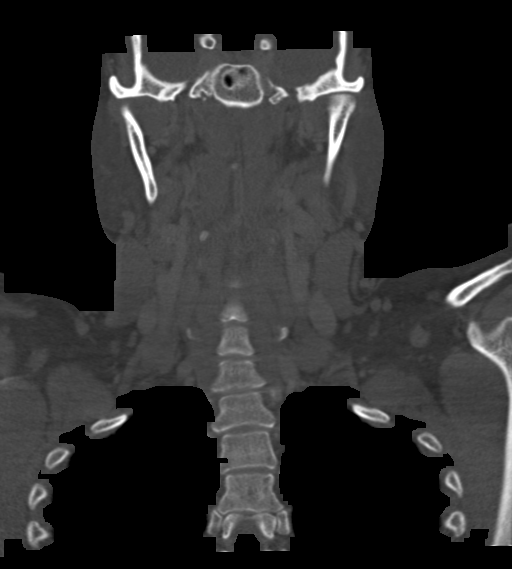

[Series 6: sag neck · sagittal · 0.45mm/px · 5 of 110 slices shown, 6 images]
[im 37/110  bone]
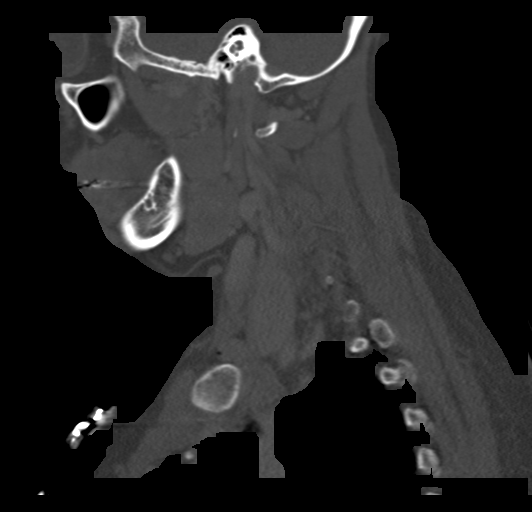
[im 46/110  bone]
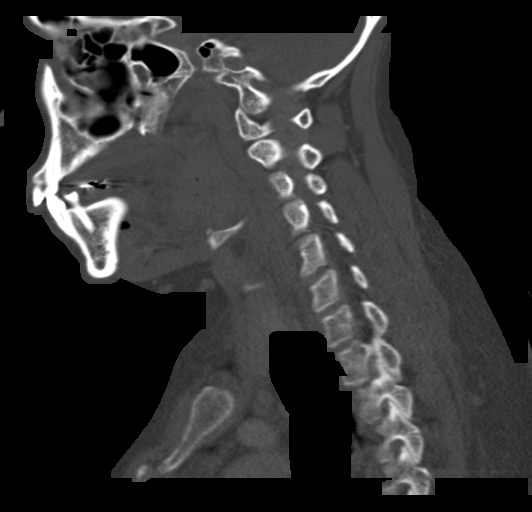
[im 55/110  soft-tissue]
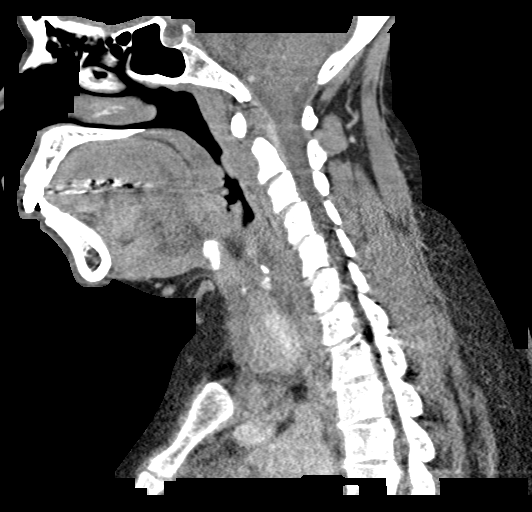
[im 55/110  bone]
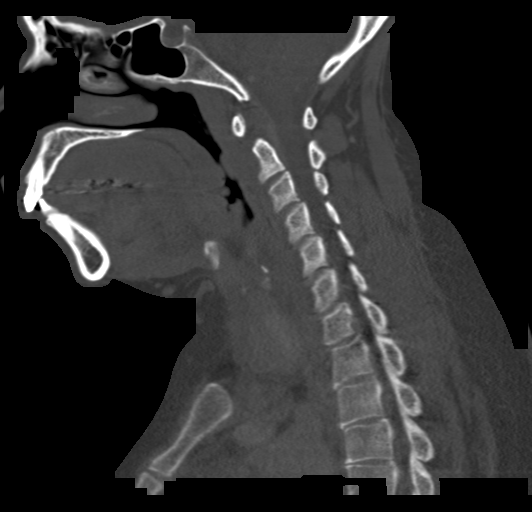
[im 64/110  bone]
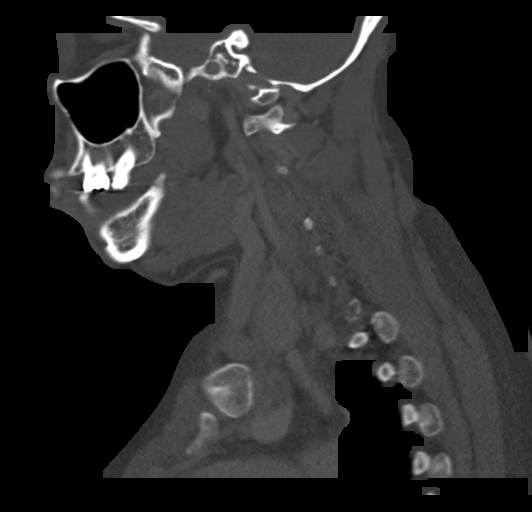
[im 73/110  bone]
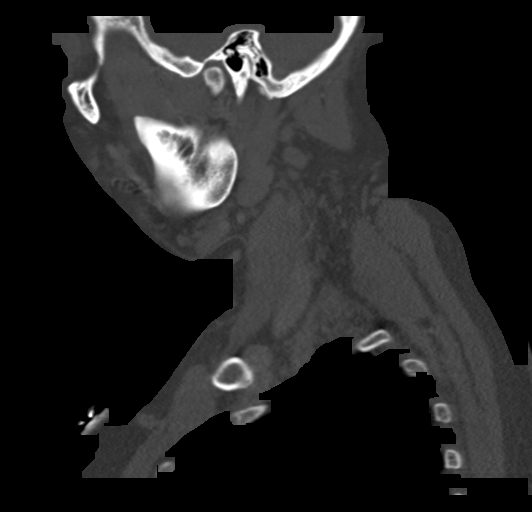

[13 of 33 positions shown; findings below may reference images not displayed]

FINDINGS: Pharynx and larynx: Mild asymmetry of the pharynx with medial
deviation of the right false cord. No laryngeal mass.

Pharyngeal soft tissue contours are within normal limits. Negative
parapharyngeal and retropharyngeal spaces.

Salivary glands: Negative sublingual space. Submandibular glands are
symmetric and within normal limits. Parotid glands are symmetric and
within normal limits.

Thyroid: Generalized thyromegaly. Mild associated regional mass
effect, but no narrowing of the trachea. In the anterior superior
mediastinum there is a lobulated 22 x 26 by 22 millimeter soft
tissue mass (AP by transverse by CC) which appears inseparable from
the left thyroid lower pole on sagittal image 54 and is probably
retrosternal extension of goiter.

Lymph nodes: Maximal to mildly enlarged bilateral level 2A lymph
nodes measure up to 12 millimeters diameter (series 2, image 45). No
necrotic or heterogeneous lymph nodes. The level 2 B and other nodal
stations in the bilateral neck appear within normal limits.

Vascular: The major vascular structures in the neck remain patent
including the right IJ, although there are large anterior thyroidal
vein collaterals. At the right skull base masslike enhancement
persists in an area of about 2 centimeters corresponding to the
known vascular paraganglioma (series 2, image 17). Inside the skull
both sagittal dural venous sinuses appear patent.

Limited intracranial: Intracranial extra-axial component of the
paraganglioma redemonstrated at the level of the right jugular
foramen and hypoglossal canal (series 2, image 10). This appears
stable compared to 12/09/2017. Otherwise negative visible brain
parenchyma.

Visualized orbits: Negative.

Mastoids and visualized paranasal sinuses: Stable and well
pneumatized, chronic small right maxillary sinus mucous retention
cysts. The right tympanic cavity and mastoids remain clear.

Skeleton: Chronic erosion of the right jugular foramen, although
with a fairly well-circumscribed appearance (series 3, image 13) is
stable since 12/14/2017. No new No acute osseous abnormality
identified.

Upper chest: Left anterior superior mediastinal mass. As described
in the thyroid section above no superior mediastinal
lymphadenopathy. Partially visible and questionable central
pulmonary artery enlargement. Negative lung apices. Normal visible
axillary lymph nodes.
IMPRESSION: 1. Right skull base paraganglioma appears stable since the CT and
MRI in [REDACTED], and the right IJ remains patent.
2. Suspicion of right vocal cord palsy, which is likely the sequelae
3. Thyroid goiter with retrosternal extension into the left superior
mediastinum.
4. Maximal to mildly enlarged bilateral level 2A lymph nodes are
nonspecific, with no other lymphadenopathy or or abnormality
identified.

## 2019-08-26 IMAGING — CR DG CHEST 2V
2 series · 2 of 2 positions shown · non-contrast
Comparison: 08/28/2010

CLINICAL DATA: She c/o right-sided chest pain radiating into right
arm x 3 weeks. She states she underwent a one-time irradiation to
right head for a "benign tumor" almost two months ago. H/o cardiac
murmur. Smoker.

EXAM:
CHEST - 2 VIEW

[w chest pa]
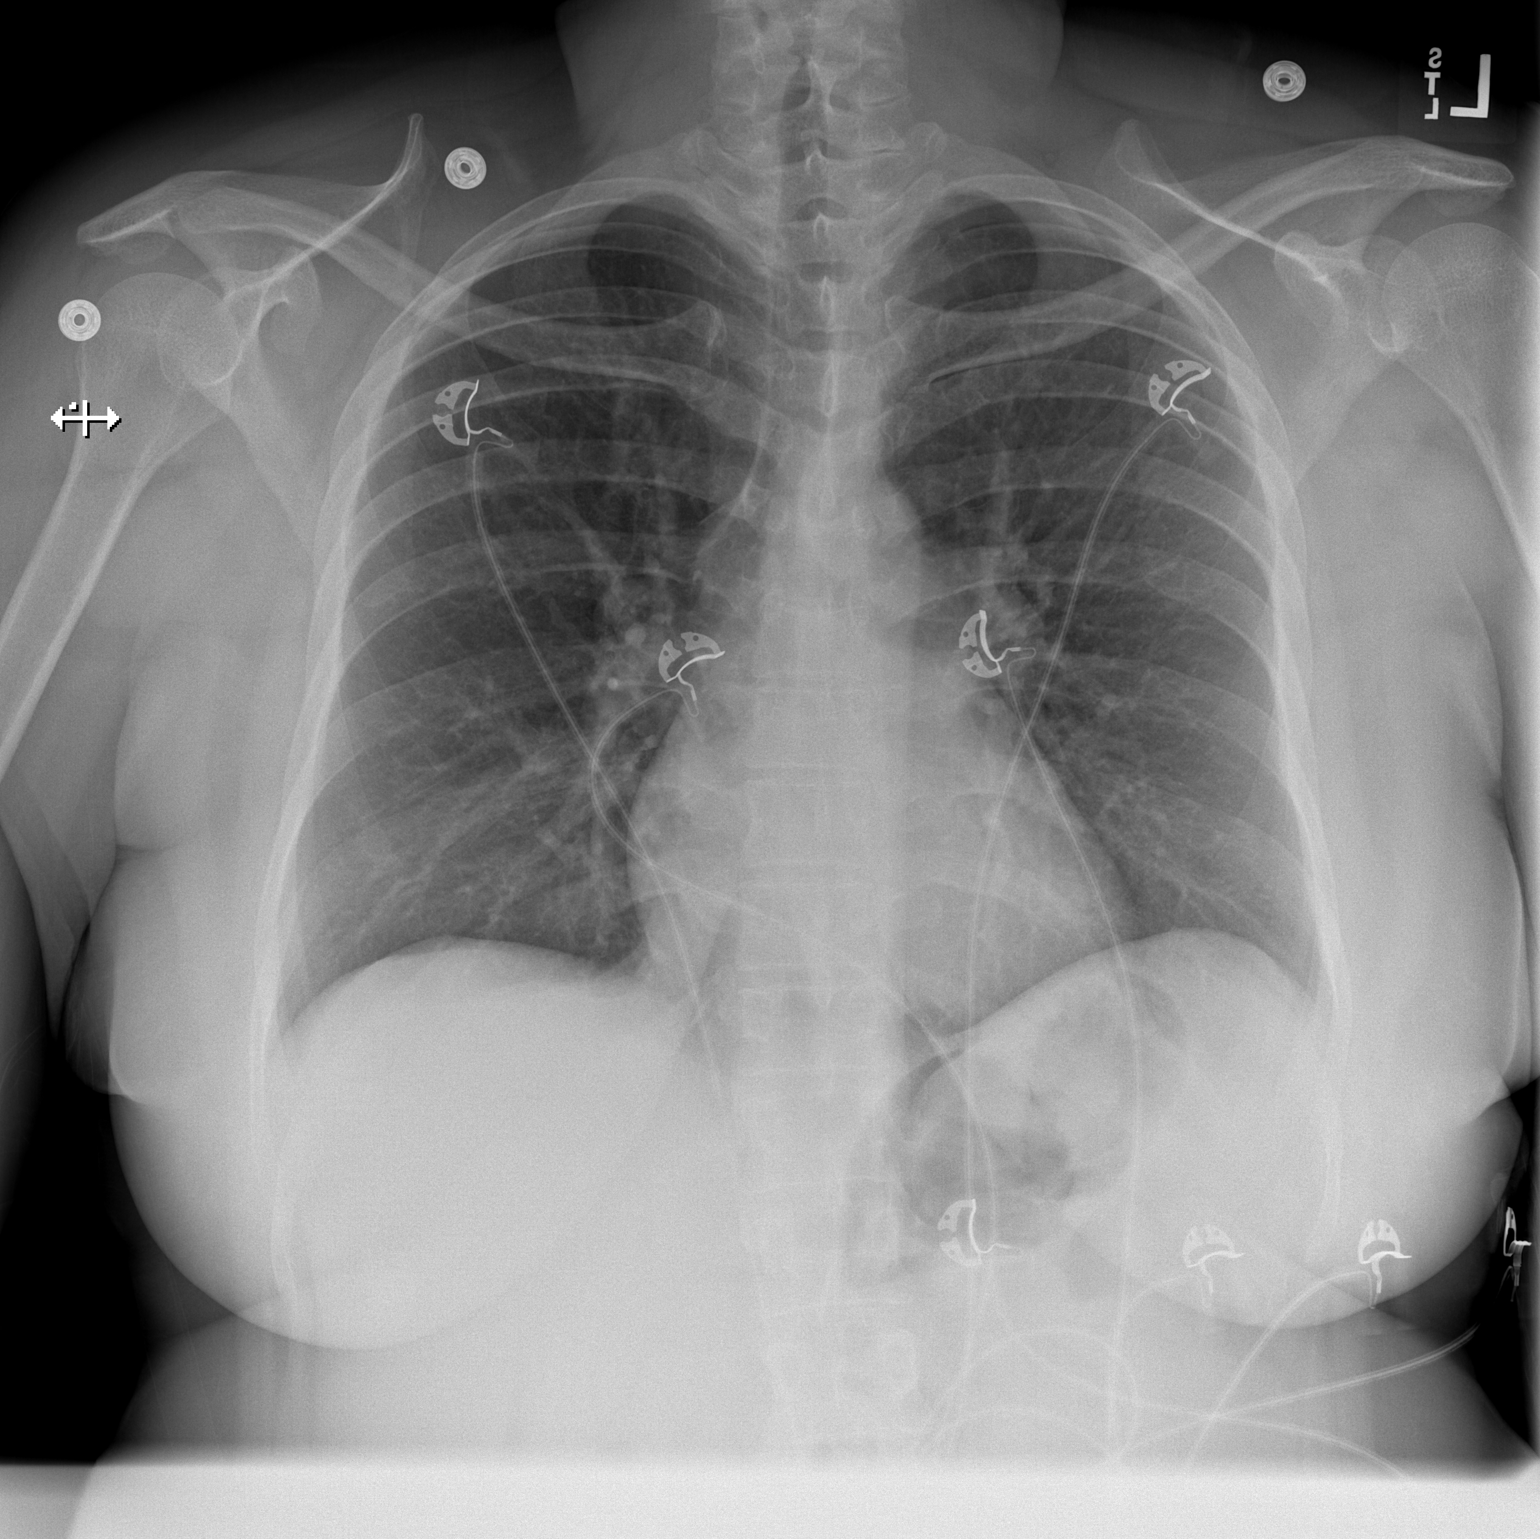

[w chest lat]
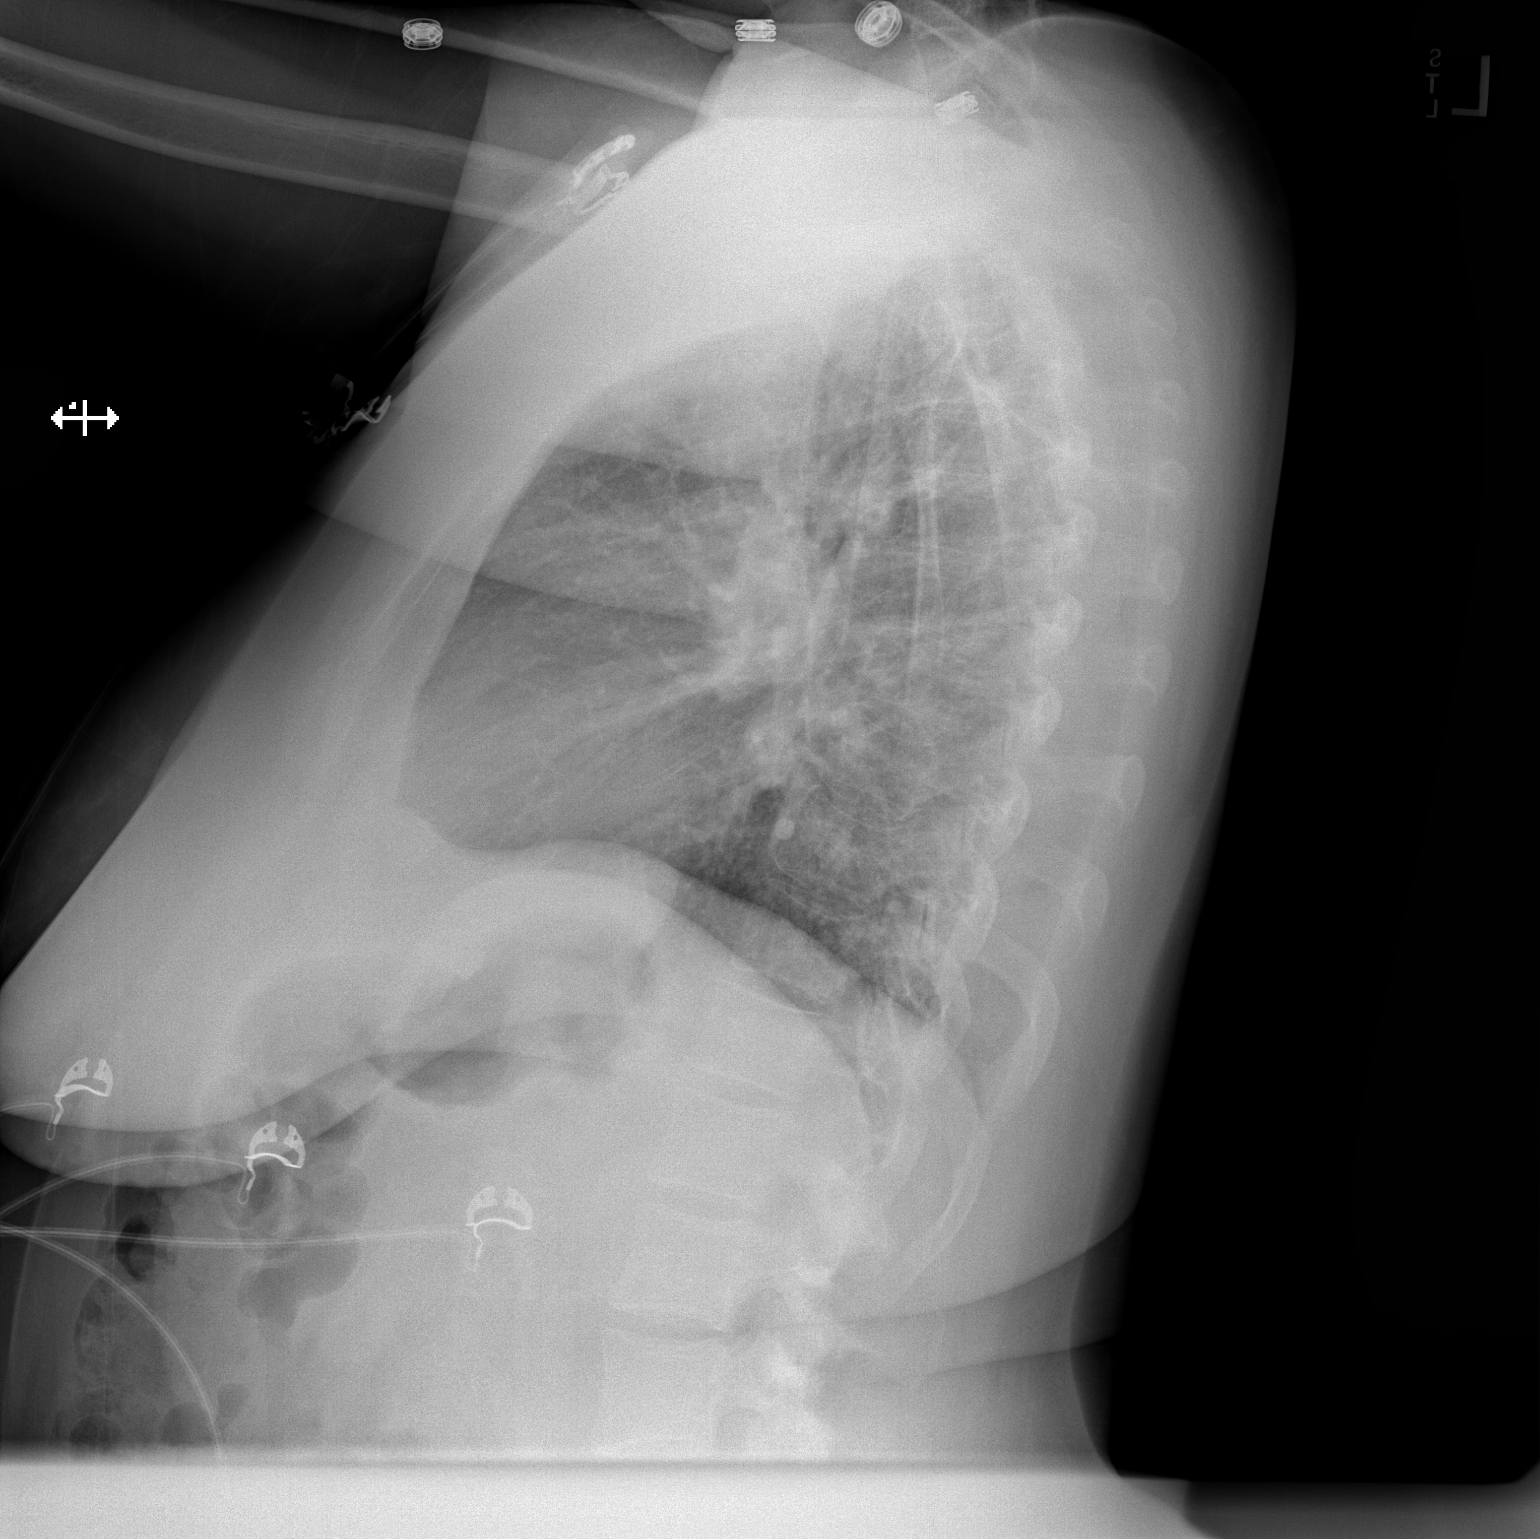

[2 of 2 positions shown; findings below may reference images not displayed]

FINDINGS: Lateral view degraded by patient arm position. Numerous leads and
wires project over the chest. Midline trachea. Normal heart size and
mediastinal contours. No pleural effusion or pneumothorax. Clear
lungs.
IMPRESSION: No acute cardiopulmonary disease.

## 2019-12-23 ENCOUNTER — Other Ambulatory Visit: Payer: Self-pay | Admitting: Obstetrics & Gynecology

## 2019-12-23 DIAGNOSIS — R928 Other abnormal and inconclusive findings on diagnostic imaging of breast: Secondary | ICD-10-CM

## 2019-12-24 ENCOUNTER — Other Ambulatory Visit: Payer: Self-pay

## 2019-12-24 DIAGNOSIS — N6320 Unspecified lump in the left breast, unspecified quadrant: Secondary | ICD-10-CM

## 2019-12-24 DIAGNOSIS — N6310 Unspecified lump in the right breast, unspecified quadrant: Secondary | ICD-10-CM

## 2020-01-01 ENCOUNTER — Ambulatory Visit: Payer: Self-pay

## 2020-01-01 ENCOUNTER — Encounter (INDEPENDENT_AMBULATORY_CARE_PROVIDER_SITE_OTHER): Payer: Self-pay

## 2020-01-01 ENCOUNTER — Ambulatory Visit
Admission: RE | Admit: 2020-01-01 | Discharge: 2020-01-01 | Disposition: A | Discharge status: Home / Self Care | Payer: No Typology Code available for payment source | Source: Ambulatory Visit | Attending: Obstetrics and Gynecology | Admitting: Obstetrics and Gynecology

## 2020-01-01 ENCOUNTER — Other Ambulatory Visit: Payer: Self-pay

## 2020-01-01 ENCOUNTER — Ambulatory Visit
Admission: RE | Admit: 2020-01-01 | Discharge: 2020-01-01 | Disposition: A | Discharge status: Home / Self Care | Payer: Medicaid Other | Source: Ambulatory Visit | Attending: Obstetrics and Gynecology | Admitting: Obstetrics and Gynecology

## 2020-01-01 ENCOUNTER — Other Ambulatory Visit: Payer: Self-pay | Admitting: Obstetrics and Gynecology

## 2020-01-01 VITALS — BP 138/88 | Temp 97.8°F | Wt 207.0 lb

## 2020-01-01 DIAGNOSIS — Z1239 Encounter for other screening for malignant neoplasm of breast: Secondary | ICD-10-CM

## 2020-01-01 DIAGNOSIS — N6310 Unspecified lump in the right breast, unspecified quadrant: Secondary | ICD-10-CM

## 2020-01-01 DIAGNOSIS — N6320 Unspecified lump in the left breast, unspecified quadrant: Secondary | ICD-10-CM

## 2020-01-01 NOTE — Progress Notes (Signed)
Ms. DELMIRA GRIFFITHS is a 44 y.o. female who presents to [facility] clinic today for clinical breast exam.  She reports doing daily breast exams, in the shower, and has no complaints.   She reports some anxiety today d/t mother being diagnosed and dying of breast cancer at 43 years old.  Patient further reports that 52 women in her family has been diagnosed with breast cancer since her mother, but are living.    Pap Smear: Pap not smear completed today. Last Pap smear was February 24, 2015 at 44 [facility] and was normal. Per patient has no history of an abnormal Pap smear. She had a vaginal hysterectomy with bilateral salpingectomy in Sept 2016 s/t heavy menstrual bleeding.  Last Pap smear result is available in Epic.   Physical exam: Breasts Breasts symmetrical. No skin abnormalities bilateral breasts. No nipple retraction bilateral breasts. No nipple discharge bilateral breasts. No lymphadenopathy. No lumps palpated in left breast.  Right breast with lumps palpated on inner upper at 1'o clock (~1cm) and 3'oclock (~1cm) near nipple. Areas tender, lumps non-mobile. Marker placed.  Additional lump palpated at ~5'o clock- tender, marker placed.    Pelvic/Bimanual Pap is not indicated today    Smoking History: Patient is a current smoker of cigars and was referred to quit line.    Patient Navigation: Patient education provided. Access to services provided for patient through [place] [facility] program.   Colorectal Cancer Screening: Per patient has never had colonoscopy completed No complaints today.    Breast and Cervical Cancer Risk Assessment: Patient has family history of breast cancer, known genetic mutations, or radiation treatment to the chest before age 78. Patient does not have history of cervical dysplasia, immunocompromised, or DES exposure in-utero.  Risk Assessment    Risk Scores      01/01/2020   Last edited by: Demetrius Revel, LPN   5-year risk: 1.1 %   Lifetime risk: 14.8 %           A: BCCCP Clinical Breast Exam No Pap Smear  P: Referred patient to the [facility] for a diagnostic mammogram. Appointment scheduled for April 15th at 1240pm  Gavin Pound, Mallory Shirk 01/01/2020 9:55 AM

## 2020-01-02 ENCOUNTER — Other Ambulatory Visit: Payer: Self-pay

## 2020-02-23 ENCOUNTER — Other Ambulatory Visit: Payer: Self-pay | Admitting: Urology

## 2020-02-23 DIAGNOSIS — R310 Gross hematuria: Secondary | ICD-10-CM

## 2020-02-26 ENCOUNTER — Ambulatory Visit (HOSPITAL_COMMUNITY): Admission: RE | Admit: 2020-02-26 | Payer: Self-pay | Source: Ambulatory Visit

## 2020-02-26 ENCOUNTER — Encounter (HOSPITAL_COMMUNITY): Payer: Self-pay

## 2020-07-06 ENCOUNTER — Ambulatory Visit
Admission: RE | Admit: 2020-07-06 | Discharge: 2020-07-06 | Disposition: A | Discharge status: Home / Self Care | Payer: No Typology Code available for payment source | Source: Ambulatory Visit | Attending: Obstetrics and Gynecology | Admitting: Obstetrics and Gynecology

## 2020-07-06 ENCOUNTER — Other Ambulatory Visit: Payer: Self-pay

## 2020-07-06 DIAGNOSIS — N6310 Unspecified lump in the right breast, unspecified quadrant: Secondary | ICD-10-CM

## 2020-12-06 ENCOUNTER — Other Ambulatory Visit: Payer: Self-pay

## 2020-12-06 DIAGNOSIS — Z08 Encounter for follow-up examination after completed treatment for malignant neoplasm: Secondary | ICD-10-CM

## 2020-12-06 DIAGNOSIS — Z09 Encounter for follow-up examination after completed treatment for conditions other than malignant neoplasm: Secondary | ICD-10-CM

## 2020-12-06 DIAGNOSIS — Z853 Personal history of malignant neoplasm of breast: Secondary | ICD-10-CM

## 2021-01-11 ENCOUNTER — Ambulatory Visit: Payer: Medicaid Other

## 2021-01-11 ENCOUNTER — Ambulatory Visit
Admission: RE | Admit: 2021-01-11 | Discharge: 2021-01-11 | Disposition: A | Discharge status: Home / Self Care | Payer: Medicaid Other | Source: Ambulatory Visit | Attending: Obstetrics and Gynecology | Admitting: Obstetrics and Gynecology

## 2021-01-11 ENCOUNTER — Other Ambulatory Visit: Payer: Self-pay

## 2021-01-11 ENCOUNTER — Ambulatory Visit: Payer: Medicaid Other | Admitting: *Deleted

## 2021-01-11 VITALS — BP 126/80 | Wt 212.1 lb

## 2021-01-11 DIAGNOSIS — R2231 Localized swelling, mass and lump, right upper limb: Secondary | ICD-10-CM

## 2021-01-11 DIAGNOSIS — Z1239 Encounter for other screening for malignant neoplasm of breast: Secondary | ICD-10-CM

## 2021-01-11 NOTE — Progress Notes (Signed)
[name] is a 45 y.o. female who presents to [facility] clinic today with no complaints. Patient referred to [facility] by the [facility] due to 91-month diagnostic mammogram and right breast ultrasound recommended for follow-up. Last diagnostic bilateral diagnostic mammogram completed 01/01/2020 and right breast ultrasound 07/06/2020.   Pap Smear: Pap smear not completed today. [facility].Per patient has no history of an abnormal Pap smear. Patient had a vaginal hysterectomy with bilateral salpingectomy in Sept 2016 due to fibroids, AUB, and heavy menstrual bleeding. Patient doesn't need any further Pap smears due to her history of a hysterectomy for benign reasons per BCCCP and ASCCP guidelines. Last Pap smear resultis available in Epic.   Physical exam: Breasts Breasts symmetrical. No skin abnormalities bilateral breasts. No nipple retraction bilateral breasts. No nipple discharge bilateral breasts. No lymphadenopathy. No lumps palpated left breast. Palpated a bb sized lump within the right axilla at 11 o'clock 16 cm from the nipple. Complaints of tenderness when palpated right axillary lump on exam.      MS DIGITAL DIAG TOMO BILAT  Result Date: 01/01/2020 CLINICAL DATA:  Two possible masses in each breast and calcifications in the right breast on a recent screening mammogram. Her mother was diagnosed breast cancer at age 12. EXAM: DIGITAL DIAGNOSTIC BILATERAL MAMMOGRAM WITH CAD AND TOMO ULTRASOUND BILATERAL BREAST COMPARISON:  Previous exam(s). ACR Breast Density Category c: The breast tissue is heterogeneously dense, which may obscure small masses. FINDINGS: 3D tomographic and 2D generated spot compression views of both breasts were obtained as well as 2D true lateral and spot magnification views of the right breast. These demonstrate multiple markedly to moderately dilated retroareolar ducts bilaterally, corresponding to  the recently suspected masses. There is also a small, rounded, circumscribed mass in the posterior central right breast, slightly laterally. There is a small, rounded, circumscribed mass in the central left breast. Also demonstrated is a larger, oval, circumscribed mass in the posterior left breast, slightly laterally, in the craniocaudal projection. There are a large number of punctate calcifications throughout the outer right breast. Multiple of these demonstrate dependent layering in the true lateral projection. Mammographic images were processed with CAD. Targeted ultrasound is performed, showing multiple significantly dilated retroareolar ducts in both breasts with no masses in either retroareolar region. In the 12 o'clock position of the right breast, 3 cm from the nipple, a 1.7 x 1.7 x 0.8 cm oval, horizontally oriented, circumscribed, hypoechoic mass is demonstrated. This contains some coarse calcifications. Also demonstrated is a 1.0 cm simple cyst in the 12 o'clock position of the right breast, 4 cm from the nipple. There is also a smaller cyst adjacent to the superior aspect of the solid mass. In the 1 o'clock position of the left breast, 5 cm from the nipple, 2 adjacent cysts or a bilobed cyst with a thin internal septation are demonstrated, measuring 1.9 x 1.1 x 0.8 cm. In the 11:30 o'clock position of the left breast, posteriorly, there is a bilobed simple cyst measuring 2.0 cm in maximum diameter. No solid masses were seen on the left. IMPRESSION: 1. 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 2. Bilateral benign breast cysts. 3. Bilateral benign breast duct ectasia. 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 This has been discussed with the patient and scheduled. I have discussed the  findings and recommendations with the patient. If applicable, a reminder letter  will be sent to the patient regarding the next appointment. BI-RADS CATEGORY  3: Probably benign. Electronically Signed   By: Claudie Revering M.D.   On: 01/01/2020 14:38    Pelvic/Bimanual Pap is not indicated today per BCCCP guidelines.   Smoking History: Patient is a current smoker. Discussed smoking cessation with patient. Referred to the [facility] Quitline and gave resources to the free smoking cessation classes at [facility].    Patient Navigation: Patient education provided. Access to services provided for patient through BCCCP program.    Breast and Cervical Cancer Risk Assessment: Patient has family history of her mother, one maternal aunt, and multiple second cousins having breast cancer. Patient has no known genetic mutations or history of radiation treatment to the chest before age 69. Patient does not have history of cervical dysplasia, immunocompromised, or DES exposure in-utero.  Risk Assessment    Risk Scores      01/11/2021 01/01/2020   Last edited by: Demetrius Revel, LPN McGill, Sherie Mamie Nick, LPN   5-year risk: 1.2 % 1.1 %   Lifetime risk: 14.7 % 14.8 %          A: BCCCP exam without pap smear No complaints.  P: Referred patient to the [place] for a diagnostic mammogram per recommendation. Appointment scheduled Tuesday, January 11, 2021 at 1400.  Loletta Parish, RN 01/11/2021 1:25 PM

## 2021-01-11 NOTE — Patient Instructions (Signed)
Explained breast self awareness with Jetty Duhamel. Patient did not need a Pap smear today due to patient has a history of a hysterectomy for benign reasons. Let patient know that she doesn't need any further Pap smears due to her history of a hysterectomy for benign reasons. Referred patient to the [facility] for a diagnostic mammogram per recommendation. Appointment scheduled Tuesday, January 11, 2021 at 1400. Patient aware of appointment and will be there. Discussed smoking cessation with patient. Referred to the [facility] Quitline and gave resources to the free smoking cessation classes at [facility]. Jetty Duhamel verbalized understanding.  Sacha Radloff, Arvil Chaco, RN 1:25 PM

## 2021-04-04 ENCOUNTER — Emergency Department (HOSPITAL_BASED_OUTPATIENT_CLINIC_OR_DEPARTMENT_OTHER): Payer: Medicaid Other

## 2021-04-04 ENCOUNTER — Other Ambulatory Visit: Payer: Self-pay

## 2021-04-04 ENCOUNTER — Encounter (HOSPITAL_BASED_OUTPATIENT_CLINIC_OR_DEPARTMENT_OTHER): Payer: Self-pay

## 2021-04-04 ENCOUNTER — Emergency Department (HOSPITAL_BASED_OUTPATIENT_CLINIC_OR_DEPARTMENT_OTHER): Payer: Self-pay

## 2021-04-04 ENCOUNTER — Emergency Department (HOSPITAL_BASED_OUTPATIENT_CLINIC_OR_DEPARTMENT_OTHER)
Admission: EM | Admit: 2021-04-04 | Discharge: 2021-04-04 | Disposition: A | Discharge status: Home / Self Care | Payer: Self-pay | Attending: Emergency Medicine | Admitting: Emergency Medicine

## 2021-04-04 DIAGNOSIS — R0602 Shortness of breath: Secondary | ICD-10-CM | POA: Insufficient documentation

## 2021-04-04 DIAGNOSIS — K8050 Calculus of bile duct without cholangitis or cholecystitis without obstruction: Secondary | ICD-10-CM | POA: Insufficient documentation

## 2021-04-04 DIAGNOSIS — K810 Acute cholecystitis: Secondary | ICD-10-CM

## 2021-04-04 DIAGNOSIS — R748 Abnormal levels of other serum enzymes: Secondary | ICD-10-CM | POA: Insufficient documentation

## 2021-04-04 DIAGNOSIS — R079 Chest pain, unspecified: Secondary | ICD-10-CM | POA: Insufficient documentation

## 2021-04-04 DIAGNOSIS — F17210 Nicotine dependence, cigarettes, uncomplicated: Secondary | ICD-10-CM | POA: Insufficient documentation

## 2021-04-04 DIAGNOSIS — K8020 Calculus of gallbladder without cholecystitis without obstruction: Secondary | ICD-10-CM | POA: Insufficient documentation

## 2021-04-04 LAB — CBC WITH DIFFERENTIAL/PLATELET
Abs Immature Granulocytes: 0.03 10*3/uL (ref 0.00–0.07)
Basophils Absolute: 0 10*3/uL (ref 0.0–0.1)
Basophils Relative: 0 %
Eosinophils Absolute: 0.3 10*3/uL (ref 0.0–0.5)
Eosinophils Relative: 3 %
HCT: 40.1 % (ref 36.0–46.0)
Hemoglobin: 13.3 g/dL (ref 12.0–15.0)
Immature Granulocytes: 0 %
Lymphocytes Relative: 42 %
Lymphs Abs: 4.4 10*3/uL — ABNORMAL HIGH (ref 0.7–4.0)
MCH: 27.7 pg (ref 26.0–34.0)
MCHC: 33.2 g/dL (ref 30.0–36.0)
MCV: 83.5 fL (ref 80.0–100.0)
Monocytes Absolute: 0.6 10*3/uL (ref 0.1–1.0)
Monocytes Relative: 6 %
Neutro Abs: 5.1 10*3/uL (ref 1.7–7.7)
Neutrophils Relative %: 49 %
Platelets: 304 10*3/uL (ref 150–400)
RBC: 4.8 MIL/uL (ref 3.87–5.11)
RDW: 12.6 % (ref 11.5–15.5)
WBC: 10.5 10*3/uL (ref 4.0–10.5)
nRBC: 0 % (ref 0.0–0.2)

## 2021-04-04 LAB — COMPREHENSIVE METABOLIC PANEL
ALT: 12 U/L (ref 0–44)
AST: 11 U/L — ABNORMAL LOW (ref 15–41)
Albumin: 4 g/dL (ref 3.5–5.0)
Alkaline Phosphatase: 86 U/L (ref 38–126)
Anion gap: 9 (ref 5–15)
BUN: 13 mg/dL (ref 6–20)
CO2: 25 mmol/L (ref 22–32)
Calcium: 8.7 mg/dL — ABNORMAL LOW (ref 8.9–10.3)
Chloride: 106 mmol/L (ref 98–111)
Creatinine, Ser: 0.71 mg/dL (ref 0.44–1.00)
GFR, Estimated: >60 mL/min (ref >60)
Glucose, Bld: 130 mg/dL — ABNORMAL HIGH (ref 70–99)
Potassium: 3.7 mmol/L (ref 3.5–5.1)
Sodium: 140 mmol/L (ref 135–145)
Total Bilirubin: 0.3 mg/dL (ref 0.3–1.2)
Total Protein: 6.9 g/dL (ref 6.5–8.1)

## 2021-04-04 LAB — D-DIMER, QUANTITATIVE: D-Dimer, Quant: 0.53 ug/mL-FEU — ABNORMAL HIGH (ref 0.00–0.50)

## 2021-04-04 LAB — TROPONIN I (HIGH SENSITIVITY)
Troponin I (High Sensitivity): <2 ng/L (ref <18)
Troponin I (High Sensitivity): <2 ng/L (ref <18)

## 2021-04-04 LAB — LIPASE, BLOOD: Lipase: 71 U/L — ABNORMAL HIGH (ref 11–51)

## 2021-04-04 MED ORDER — ONDANSETRON HCL 4 MG PO TABS: 4.0000 mg | ORAL_TABLET | Freq: Four times a day (QID) | ORAL | 0 refills | Status: DC | PRN

## 2021-04-04 MED ORDER — MORPHINE SULFATE (PF) 4 MG/ML IV SOLN
4.0000 mg | Freq: Once | INTRAVENOUS | Status: AC
  Administered 2021-04-04: 4 mg via INTRAVENOUS
  Filled 2021-04-04: qty 1

## 2021-04-04 MED ORDER — HYDROCODONE-ACETAMINOPHEN 5-325 MG PO TABS
1.0000 | ORAL_TABLET | Freq: Four times a day (QID) | ORAL | 0 refills | Status: DC | PRN
Start: 2021-04-04 — End: 2021-04-08

## 2021-04-04 MED ORDER — ONDANSETRON HCL 4 MG/2ML IJ SOLN
4.0000 mg | Freq: Once | INTRAMUSCULAR | Status: AC
  Administered 2021-04-04: 4 mg via INTRAVENOUS
  Filled 2021-04-04: qty 2

## 2021-04-04 MED ORDER — HYDROCODONE-ACETAMINOPHEN 5-325 MG PO TABS: 1.0000 | ORAL_TABLET | Freq: Four times a day (QID) | ORAL | 0 refills | Status: DC | PRN

## 2021-04-04 MED ORDER — IOHEXOL 350 MG/ML SOLN
100.0000 mL | Freq: Once | INTRAVENOUS | Status: AC | PRN
  Administered 2021-04-04: 100 mL via INTRAVENOUS

## 2021-04-04 MED ORDER — HYDROCODONE-ACETAMINOPHEN 5-325 MG PO TABS
1.0000 | ORAL_TABLET | Freq: Four times a day (QID) | ORAL | 0 refills | Status: DC | PRN
Start: 2021-04-04 — End: 2021-04-04

## 2021-04-04 MED ORDER — DIATRIZOATE MEGLUMINE & SODIUM 66-10 % PO SOLN
500.0000 mL | Freq: Once | ORAL | Status: AC
  Administered 2021-04-04: 500 mL via ORAL
  Filled 2021-04-04: qty 510

## 2021-04-04 NOTE — ED Provider Notes (Signed)
[facility] EMERGENCY DEPT Provider Note   CSN: [id number] Arrival date & time: 04/04/21  8295     History Chief Complaint  Patient presents with   Shortness of Breath   Back Pain   Flank Pain    Kristina Castro is a 45 y.o. female.  The history is provided by the patient.  She has history of gestational diabetes and comes in complaining of right-sided chest pain.  She is having severe, sharp pain in the right posterior rib cage radiating around to the anterior right chest.  Pain is rated at 10/10.  It is worse with taking a deep breath, laying flat, palpation.  Nothing seems to make it better.  She tried taking a dose of naproxen without any relief.  She does endorse dyspnea, nausea, diaphoresis.  She denies vomiting and she denies cough.  There have been no fever or chills.  She denies any recent trauma.  She denies any recent surgery or travel or exogenous estrogen use.  She has not had pain like this before.  She does smoke black and mild cigars about 3 times a week.  There is no history of hypertension or hyperlipidemia and no family history of premature coronary atherosclerosis.   Past Medical History:  Diagnosis Date   GERD (gastroesophageal reflux disease)    Heavy menstrual bleeding    History of abnormal cervical Pap smear    History of cardiac murmur    only during one pregancy--    History of gastritis    1998   History of gestational diabetes    History of small bowel obstruction    05-11-2013 resolved without surgical intervention   HSV-2 (herpes simplex virus 2) infection 2007   currently taking meds.    Patient Active Problem List   Diagnosis Date Noted   Cervicalgia 06/17/2018   Acute pain of right shoulder 06/17/2018   Postoperative intra-abdominal abscess    Pelvic abscess in female    Pelvic mass in female 06/13/2015   S/P hysterectomy 05/25/2015   Abdominal pain 05/11/2013    Past Surgical History:  Procedure Laterality Date    BILATERAL SALPINGECTOMY Bilateral 05/25/2015   Procedure: BILATERAL SALPINGECTOMY;  Surgeon: Linda Hedges, DO;  Location: [facility];  Service: Gynecology;  Laterality: Bilateral;   DILATION AND EVACUATION  x3   last one 2011   IUD REMOVAL Left 05/25/2015   Procedure: NEXPLANON REMOVAL LEFT UPPER ARM;  Surgeon: Linda Hedges, DO;  Location: [facility];  Service: Gynecology;  Laterality: Left;   LAPAROSCOPIC ASSISTED VAGINAL HYSTERECTOMY N/A 05/25/2015   Procedure: LAPAROSCOPIC ASSISTED VAGINAL HYSTERECTOMY;  Surgeon: Linda Hedges, DO;  Location: [facility];  Service: Gynecology;  Laterality: N/A;     OB History     Gravida  7   Para  4   Term  4   Preterm  0   AB  3   Living  4      SAB  0   IAB  3   Ectopic  0   Multiple  0   Live Births  4           Family History  Problem Relation Age of Onset   Cancer Mother        breast   Diabetes Mother    Hypertension Father    Kidney disease Father    Hypertension Paternal Aunt    Stroke Maternal Grandmother    Diabetes Maternal Grandmother  Cirrhosis Brother    Anesthesia problems Neg Hx     Social History   Tobacco Use   Smoking status: Some Days    Packs/day: 0.00    Years: 5.00    Pack years: 0.00    Types: Cigars, Cigarettes   Smokeless tobacco: Never   Tobacco comments:    hx smoking on and off--  currently cigar's 1pp2wk occasional  Vaping Use   Vaping Use: Never used  Substance Use Topics   Alcohol use: Yes    Alcohol/week: 7.0 standard drinks    Types: 7 Glasses of wine per week    Comment: daily wine   Drug use: No    Home Medications Prior to Admission medications   Medication Sig Start Date End Date Taking? Authorizing Provider  acyclovir (ZOVIRAX) 800 MG tablet Take 800 mg by mouth 5 (five) times daily.    [provider]  amoxicillin-clavulanate (AUGMENTIN) 875-125 MG tablet Take 1 tablet by mouth every 12 (twelve) hours. Patient  not taking: Reported on 05/05/2018 06/17/15   Linda Hedges, DO  docusate sodium (COLACE) 100 MG capsule Take 1 capsule (100 mg total) by mouth 2 (two) times daily. Patient not taking: Reported on 05/05/2018 05/27/15   Linda Hedges, DO  ferrous sulfate 325 (65 FE) MG tablet Take 1 tablet (325 mg total) by mouth 2 (two) times daily with a meal. Patient not taking: Reported on 05/05/2018 05/27/15   Linda Hedges, DO  HYDROcodone-acetaminophen (NORCO/VICODIN) 5-325 MG tablet Take 1 tablet by mouth every 6 (six) hours as needed for severe pain. 05/05/18   Carmin Muskrat, MD  ibuprofen (ADVIL,MOTRIN) 200 MG tablet Take 400 mg by mouth every 6 (six) hours as needed for moderate pain.    [provider]  ibuprofen (ADVIL,MOTRIN) 800 MG tablet Take 1 tablet (800 mg total) by mouth every 6 (six) hours as needed. Patient not taking: Reported on 05/05/2018 05/27/15   Linda Hedges, DO  oxyCODONE-acetaminophen (PERCOCET/ROXICET) 5-325 MG per tablet Take 1-2 tablets by mouth every 4 (four) hours as needed (moderate to severe pain (when tolerating fluids)). Patient not taking: Reported on 05/05/2018 05/27/15   Linda Hedges, DO  oxyCODONE-acetaminophen (PERCOCET/ROXICET) 5-325 MG tablet Take 1-2 tablets by mouth every 4 (four) hours as needed (moderate to severe pain (when tolerating fluids)). Patient not taking: Reported on 05/05/2018 06/17/15   Linda Hedges, DO    Allergies    Adhesive [tape] and Gadolinium derivatives  Review of Systems   Review of Systems  All other systems reviewed and are negative.  Physical Exam Updated Vital Signs LMP 05/25/2015   Physical Exam Vitals and nursing note reviewed.  45 year old female, appears uncomfortable, but is in no acute distress. Vital signs are significant for elevated blood pressure. Oxygen saturation is 100%, which is normal. Head is normocephalic and atraumatic. PERRLA, EOMI. Oropharynx is clear. Neck is nontender and supple without adenopathy or  JVD. Back is nontender and there is no CVA tenderness. Lungs are clear without rales, wheezes, or rhonchi. Chest is markedly tender throughout the right side of the chest.  There is no crepitus.  There is no visible rash. Heart has regular rate and rhythm without murmur. Abdomen is soft, flat, nontender without masses or hepatosplenomegaly and peristalsis is normoactive. Extremities have no cyanosis or edema, full range of motion is present. Skin is warm and dry without rash. Neurologic: Mental status is normal, cranial nerves are intact, there are no motor or sensory deficits.  ED Results /  Procedures / Treatments   Labs (all labs ordered are listed, but only abnormal results are displayed) Labs Reviewed  CBC WITH DIFFERENTIAL/PLATELET - Abnormal; Notable for the following components:      Result Value   Lymphs Abs 4.4 (*)    All other components within normal limits  D-DIMER, QUANTITATIVE - Abnormal; Notable for the following components:   D-Dimer, Quant 0.53 (*)    All other components within normal limits  COMPREHENSIVE METABOLIC PANEL - Abnormal; Notable for the following components:   Glucose, Bld 130 (*)    Calcium 8.7 (*)    AST 11 (*)    All other components within normal limits  LIPASE, BLOOD - Abnormal; Notable for the following components:   Lipase 71 (*)    All other components within normal limits  TROPONIN I (HIGH SENSITIVITY)  TROPONIN I (HIGH SENSITIVITY)    EKG EKG Interpretation  Date/Time:  Monday April 04 2021 02:54:11 EDT Ventricular Rate:  77 PR Interval:  140 QRS Duration: 86 QT Interval:  348 QTC Calculation: 394 R Axis:   46 Text Interpretation: Sinus rhythm Baseline wander in lead(s) V5 Otherwise within normal limits When compared with ECG of 05/05/2018, No significant change was found Confirmed by Delora Fuel ([id number]) on 04/04/2021 3:03:06 AM  Radiology CT Angio Chest PE W and/or Wo Contrast  Result Date: 04/04/2021 CLINICAL DATA:  45 year old  female who woke this morning with back pain radiating to the right flank, shortness of breath. Nausea. EXAM: CT ANGIOGRAPHY CHEST CT ABDOMEN AND PELVIS WITH CONTRAST TECHNIQUE: Multidetector CT imaging of the chest was performed using the standard protocol during bolus administration of intravenous contrast. Multiplanar CT image reconstructions and MIPs were obtained to evaluate the vascular anatomy. Multidetector CT imaging of the abdomen and pelvis was performed using the standard protocol during bolus administration of intravenous contrast. CONTRAST:  164mL OMNIPAQUE IOHEXOL 350 MG/ML SOLN COMPARISON:  Neck CT 05/05/2018. CT Abdomen and Pelvis 06/24/2015 and earlier. Thyroid ultrasound 03/29/2009. FINDINGS: CTA CHEST FINDINGS Cardiovascular: Adequate contrast bolus timing in the pulmonary arterial tree. No focal filling defect identified in the pulmonary arteries to suggest acute pulmonary embolism. Normal heart size. No pericardial effusion. Negative thoracic aorta. Mediastinum/Nodes: Stable thyroid goiter with retrosternal extension on the left since 2019, no discrete thyroid nodule by CT. This has been evaluated on previous imaging. (ref: J Am Coll Radiol. 2015 Feb;12(2): 143-50). No mediastinal lymphadenopathy. Lungs/Pleura: Visible larynx appears symmetric now. Major airways are patent. Subtle bilateral pulmonary atelectasis and gas trapping, otherwise negative lungs. No pleural effusion. Musculoskeletal: Negative. Review of the MIP images confirms the above findings. CT ABDOMEN and PELVIS FINDINGS Hepatobiliary: Indistinct gallbladder wall on series 4, image 38. Punctate gravel type gallstones at the fundus on image 43. Common bile duct is within normal limits. Liver enhancement is normal. Pancreas: Negative. Spleen: Negative. Adrenals/Urinary Tract: Normal adrenal glands. Stable and nonobstructed kidneys. Symmetric renal enhancement and contrast excretion. Both ureters are negative to the bladder.  Unremarkable bladder. Stable pelvic phleboliths. Stomach/Bowel: Diverticulosis at the junction of the descending and sigmoid colon in the left lower quadrant. Trace diverticulosis at the hepatic flexure. No active inflammation. Negative large bowel otherwise aside from redundant transverse colon, and retained stool. Normal appendix on series 4, image 67. Decompressed and negative terminal ileum. No dilated small bowel. Stomach and duodenum appear negative. Oral contrast in the proximal jejunum. No free air, free fluid or mesenteric inflammation. Vascular/Lymphatic: Major arterial structures in the abdomen and pelvis appear patent and normal.  Portal venous system is patent. No lymphadenopathy. Reproductive: Absent uterus. Ovaries are within normal limits. Incidental pelvic phleboliths. Other: No pelvic free fluid. Musculoskeletal: Negative. Review of the MIP images confirms the above findings. IMPRESSION: 1. Negative for acute pulmonary embolus.  Normal aorta. 2. Indistinct gallbladder wall with punctate gallstones suspicious for Acute Cholecystitis. Right Upper Quadrant Ultrasound would be valuable. 3. No other acute or inflammatory process identified in the chest, abdomen, or pelvis. 4. Chronic thyroid goiter.  Mild colonic diverticulosis. Electronically Signed   By: Genevie Ann M.D.   On: 04/04/2021 05:48   CT ABDOMEN PELVIS W CONTRAST  Result Date: 04/04/2021 CLINICAL DATA:  45 year old female who woke this morning with back pain radiating to the right flank, shortness of breath. Nausea. EXAM: CT ANGIOGRAPHY CHEST CT ABDOMEN AND PELVIS WITH CONTRAST TECHNIQUE: Multidetector CT imaging of the chest was performed using the standard protocol during bolus administration of intravenous contrast. Multiplanar CT image reconstructions and MIPs were obtained to evaluate the vascular anatomy. Multidetector CT imaging of the abdomen and pelvis was performed using the standard protocol during bolus administration of  intravenous contrast. CONTRAST:  177mL OMNIPAQUE IOHEXOL 350 MG/ML SOLN COMPARISON:  Neck CT 05/05/2018. CT Abdomen and Pelvis 06/24/2015 and earlier. Thyroid ultrasound 03/29/2009. FINDINGS: CTA CHEST FINDINGS Cardiovascular: Adequate contrast bolus timing in the pulmonary arterial tree. No focal filling defect identified in the pulmonary arteries to suggest acute pulmonary embolism. Normal heart size. No pericardial effusion. Negative thoracic aorta. Mediastinum/Nodes: Stable thyroid goiter with retrosternal extension on the left since 2019, no discrete thyroid nodule by CT. This has been evaluated on previous imaging. (ref: J Am Coll Radiol. 2015 Feb;12(2): 143-50). No mediastinal lymphadenopathy. Lungs/Pleura: Visible larynx appears symmetric now. Major airways are patent. Subtle bilateral pulmonary atelectasis and gas trapping, otherwise negative lungs. No pleural effusion. Musculoskeletal: Negative. Review of the MIP images confirms the above findings. CT ABDOMEN and PELVIS FINDINGS Hepatobiliary: Indistinct gallbladder wall on series 4, image 38. Punctate gravel type gallstones at the fundus on image 43. Common bile duct is within normal limits. Liver enhancement is normal. Pancreas: Negative. Spleen: Negative. Adrenals/Urinary Tract: Normal adrenal glands. Stable and nonobstructed kidneys. Symmetric renal enhancement and contrast excretion. Both ureters are negative to the bladder. Unremarkable bladder. Stable pelvic phleboliths. Stomach/Bowel: Diverticulosis at the junction of the descending and sigmoid colon in the left lower quadrant. Trace diverticulosis at the hepatic flexure. No active inflammation. Negative large bowel otherwise aside from redundant transverse colon, and retained stool. Normal appendix on series 4, image 67. Decompressed and negative terminal ileum. No dilated small bowel. Stomach and duodenum appear negative. Oral contrast in the proximal jejunum. No free air, free fluid or  mesenteric inflammation. Vascular/Lymphatic: Major arterial structures in the abdomen and pelvis appear patent and normal. Portal venous system is patent. No lymphadenopathy. Reproductive: Absent uterus. Ovaries are within normal limits. Incidental pelvic phleboliths. Other: No pelvic free fluid. Musculoskeletal: Negative. Review of the MIP images confirms the above findings. IMPRESSION: 1. Negative for acute pulmonary embolus.  Normal aorta. 2. Indistinct gallbladder wall with punctate gallstones suspicious for Acute Cholecystitis. Right Upper Quadrant Ultrasound would be valuable. 3. No other acute or inflammatory process identified in the chest, abdomen, or pelvis. 4. Chronic thyroid goiter.  Mild colonic diverticulosis. Electronically Signed   By: Genevie Ann M.D.   On: 04/04/2021 05:48   DG Chest Port 1 View  Result Date: 04/04/2021 CLINICAL DATA:  Chest pain EXAM: PORTABLE CHEST 1 VIEW COMPARISON:  05/05/2018 FINDINGS: The heart  size and mediastinal contours are within normal limits. Both lungs are clear. The visualized skeletal structures are unremarkable. IMPRESSION: No active disease. Electronically Signed   By: Fidela Salisbury MD   On: 04/04/2021 03:33   US Abdomen Limited RUQ (LIVER/GB)  Result Date: 04/04/2021 CLINICAL DATA:  45 year old female  who woke this morning with back pain radiating to the right flank, shortness of breath. Nausea. Suspicion of gallstones and gallbladder inflammation on earlier CT Abdomen and Pelvis. EXAM: ULTRASOUND ABDOMEN LIMITED RIGHT UPPER QUADRANT COMPARISON:  CT Abdomen and Pelvis 0529 hours today. FINDINGS: Gallbladder: Multiple small gallstones, individually up to 10 mm. Gallbladder wall thickness remains normal at 2 to 3 mm. No pericholecystic fluid. No sonographic Murphy sign elicited. Common bile duct: Diameter: 3 mm, normal. Liver: No focal lesion identified. Within normal limits in parenchymal echogenicity. Portal vein is patent on color Doppler imaging with  normal direction of blood flow towards the liver. Other: Negative visible right kidney. IMPRESSION: Positive for cholelithiasis but no evidence of acute cholecystitis or bile duct obstruction. Electronically Signed   By: Genevie Ann M.D.   On: 04/04/2021 06:29    Procedures Procedures   Medications Ordered in ED Medications  morphine 4 MG/ML injection 4 mg (4 mg Intravenous Given 04/04/21 0258)  ondansetron (ZOFRAN) injection 4 mg (4 mg Intravenous Given 04/04/21 0258)    ED Course  I have reviewed the triage vital signs and the nursing notes.  Pertinent labs & imaging results that were available during my care of the patient were reviewed by me and considered in my medical decision making (see chart for details).   MDM Rules/Calculators/A&P                         Right-sided chest pain which appears to be chest wall pain.  She has minimal risk factors for coronary artery disease.  Patient risk score in heart pathway is 1 which puts her at low risk for major adverse cardiac events in the next 6 weeks.  ECG is normal.  Old records are reviewed, and she has no relevant past visits.  Doubt ACS, low risk for pulmonary embolism.  No rash to suggest herpes zoster.  No cough or fever to suggest pneumonia.  With pain extending to the right upper quadrant, also consider biliary tract disease and pancreatitis.  Screening labs and x-ray have been ordered.  She got moderate relief of pain from morphine.  Chest x-ray shows no acute process.  Troponin is normal and WBC is normal.  Mild renal insufficiency is noted.  Lipase is mildly elevated 71, and is nonspecific.  D-dimer is mildly elevated.  Will send for CT angiogram of the chest to rule out pulmonary embolism, will also get CT of abdomen to evaluate the pancreas.  CT angiogram shows no evidence of pulmonary embolism, but CT abdomen and pelvis shows cholelithiasis and possible cholecystitis.  She did require a second dose of morphine.  She is being sent for  right upper quadrant ultrasound.  Ultrasound confirms cholelithiasis but no evidence of cholecystitis.  She continued to have good pain relief and is discharged with prescriptions for hydrocodone-acetaminophen and ondansetron.  She is referred to [facility] surgery for follow-up.  Recommended she stay on a low-fat diet.  Return precautions discussed.  Final Clinical Impression(s) / ED Diagnoses Final diagnoses:  biliary colic  Calculus of gallbladder without cholecystitis without obstruction  Elevated lipase    Rx / DC Orders ED  Discharge Orders          Ordered    ondansetron (ZOFRAN) 4 MG tablet  Every 6 hours PRN        04/04/21 0643    HYDROcodone-acetaminophen (NORCO/VICODIN) 5-325 MG tablet  Every 6 hours PRN        04/04/21 1657             Delora Fuel, MD 90/38/33 [phone]

## 2021-04-04 NOTE — Discharge Instructions (Addendum)
Stay on a low-fat diet.  Return if pain is not being adequately controlled, you have uncontrolled vomiting, or start running a high fever.

## 2021-04-04 NOTE — ED Notes (Signed)
RT placed PIV in left [facility] without difficulty. PIV secured, flushed and saline locked post blood collection.

## 2021-04-04 NOTE — ED Triage Notes (Addendum)
Pt woke up this morning with lower back pain that radiates to the right flank. Pt states she is having a difficult time catching her breath and it is very painful to take a deep breath. Pt is also nauseated. Pt states she did not have that pain when she went to bed last night. Very tearful during triage.

## 2021-04-06 ENCOUNTER — Inpatient Hospital Stay (HOSPITAL_COMMUNITY)
Admission: AD | Admit: 2021-04-06 | Discharge: 2021-04-08 | DRG: 419 | Disposition: A | Discharge status: Home / Self Care | Payer: Self-pay | Source: Ambulatory Visit | Attending: General Surgery | Admitting: General Surgery

## 2021-04-06 ENCOUNTER — Encounter (HOSPITAL_COMMUNITY): Payer: Self-pay | Admitting: General Surgery

## 2021-04-06 ENCOUNTER — Other Ambulatory Visit: Payer: Self-pay | Admitting: General Surgery

## 2021-04-06 DIAGNOSIS — F17200 Nicotine dependence, unspecified, uncomplicated: Secondary | ICD-10-CM | POA: Diagnosis present

## 2021-04-06 DIAGNOSIS — Z833 Family history of diabetes mellitus: Secondary | ICD-10-CM

## 2021-04-06 DIAGNOSIS — Z8249 Family history of ischemic heart disease and other diseases of the circulatory system: Secondary | ICD-10-CM

## 2021-04-06 DIAGNOSIS — Z803 Family history of malignant neoplasm of breast: Secondary | ICD-10-CM

## 2021-04-06 DIAGNOSIS — K8012 Calculus of gallbladder with acute and chronic cholecystitis without obstruction: Principal | ICD-10-CM | POA: Diagnosis present

## 2021-04-06 DIAGNOSIS — K8000 Calculus of gallbladder with acute cholecystitis without obstruction: Secondary | ICD-10-CM | POA: Diagnosis present

## 2021-04-06 DIAGNOSIS — Z20822 Contact with and (suspected) exposure to covid-19: Secondary | ICD-10-CM | POA: Diagnosis present

## 2021-04-06 LAB — CREATININE, SERUM
Creatinine, Ser: 0.73 mg/dL (ref 0.44–1.00)
GFR, Estimated: >60 mL/min (ref >60)

## 2021-04-06 LAB — CBC
HCT: 39.7 % (ref 36.0–46.0)
Hemoglobin: 12.9 g/dL (ref 12.0–15.0)
MCH: 27.6 pg (ref 26.0–34.0)
MCHC: 32.5 g/dL (ref 30.0–36.0)
MCV: 85 fL (ref 80.0–100.0)
Platelets: 295 10*3/uL (ref 150–400)
RBC: 4.67 MIL/uL (ref 3.87–5.11)
RDW: 12.5 % (ref 11.5–15.5)
WBC: 8.5 10*3/uL (ref 4.0–10.5)
nRBC: 0 % (ref 0.0–0.2)

## 2021-04-06 MED ORDER — SODIUM CHLORIDE 0.9 % IV SOLN
2.0000 g | INTRAVENOUS | Status: DC
  Administered 2021-04-06 – 2021-04-07 (×2): 2 g via INTRAVENOUS
  Filled 2021-04-06 (×2): qty 2

## 2021-04-06 MED ORDER — ONDANSETRON 4 MG PO TBDP: 4.0000 mg | ORAL_TABLET | Freq: Four times a day (QID) | ORAL | Status: DC | PRN

## 2021-04-06 MED ORDER — OXYCODONE HCL 5 MG PO TABS
5.0000 mg | ORAL_TABLET | ORAL | Status: DC | PRN
  Administered 2021-04-06 – 2021-04-08 (×5): 10 mg via ORAL
  Filled 2021-04-06 (×5): qty 2

## 2021-04-06 MED ORDER — METOPROLOL TARTRATE 5 MG/5ML IV SOLN: 5.0000 mg | Freq: Four times a day (QID) | INTRAVENOUS | Status: DC | PRN

## 2021-04-06 MED ORDER — DEXTROSE-NACL 5-0.45 % IV SOLN: INTRAVENOUS | Status: DC

## 2021-04-06 MED ORDER — ONDANSETRON HCL 4 MG/2ML IJ SOLN
4.0000 mg | Freq: Four times a day (QID) | INTRAMUSCULAR | Status: DC | PRN
  Administered 2021-04-07: 4 mg via INTRAVENOUS
  Filled 2021-04-06: qty 2

## 2021-04-06 MED ORDER — ACETAMINOPHEN 325 MG PO TABS: 650.0000 mg | ORAL_TABLET | Freq: Four times a day (QID) | ORAL | Status: DC | PRN

## 2021-04-06 MED ORDER — ENOXAPARIN SODIUM 40 MG/0.4ML IJ SOSY
40.0000 mg | PREFILLED_SYRINGE | INTRAMUSCULAR | Status: DC
  Administered 2021-04-06 – 2021-04-07 (×2): 40 mg via SUBCUTANEOUS
  Filled 2021-04-06 (×2): qty 0.4

## 2021-04-06 MED ORDER — OXYCODONE HCL 5 MG PO TABS: 5.0000 mg | ORAL_TABLET | ORAL | Status: DC | PRN

## 2021-04-06 MED ORDER — ACETAMINOPHEN 650 MG RE SUPP
650.0000 mg | Freq: Four times a day (QID) | RECTAL | Status: DC | PRN
  Administered 2021-04-07: 650 mg via RECTAL
  Filled 2021-04-06: qty 1

## 2021-04-06 MED ORDER — DOCUSATE SODIUM 100 MG PO CAPS
100.0000 mg | ORAL_CAPSULE | Freq: Two times a day (BID) | ORAL | Status: DC
  Administered 2021-04-06 – 2021-04-08 (×4): 100 mg via ORAL
  Filled 2021-04-06 (×4): qty 1

## 2021-04-06 MED ORDER — MORPHINE SULFATE (PF) 2 MG/ML IV SOLN
2.0000 mg | INTRAVENOUS | Status: DC | PRN
  Administered 2021-04-06 – 2021-04-07 (×4): 2 mg via INTRAVENOUS
  Filled 2021-04-06 (×4): qty 1

## 2021-04-06 MED ORDER — MORPHINE SULFATE (PF) 2 MG/ML IV SOLN: 2.0000 mg | INTRAVENOUS | Status: DC | PRN

## 2021-04-06 NOTE — Progress Notes (Signed)
REFERRING PHYSICIAN:  Self   PROVIDER:  Mickeal Skinner, MD   MRN: [id number] DOB: October 10, 1975 DATE OF ENCOUNTER: 04/06/2021 Subjective   Chief Complaint: Cholelithiasis     History of Present Illness: Kristina Castro is a 45 y.o. female who is seen today as an office consultationor evaluation of Cholelithiasis .     Kristina Castro has had pain located in the RUQ and in the epigastrium. It first started 1 week. It occurs several times a week. It is worsened with activity. She has nausea and vomiting. She has had decreased frequency of stools.   Review of Systems: A complete review of systems was obtained from the patient.  I have reviewed this information and discussed as appropriate with the patient.  See HPI as well for other ROS.   ROS    Medical History: Past Medical History Past Medical History: Diagnosis Date  GERD (gastroesophageal reflux disease)        There is no problem list on file for this patient.     Past Surgical History Past Surgical History: Procedure Laterality Date  HYSTERECTOMY          Allergies Allergies Allergen Reactions  Adhesive Tape-Silicones Rash     All tapes including paper tape  Gadolinium-Containing Contrast Media Nausea And Vomiting     Pt vomited after multihance injection.       Current Outpatient Medications on File Prior to Visit Medication Sig Dispense Refill  HYDROcodone-acetaminophen (NORCO) 5-325 mg tablet Take by mouth      ondansetron (ZOFRAN) 4 MG tablet Take by mouth       No current facility-administered medications on file prior to visit.     Family History Family History Problem Relation Age of Onset  Obesity Mother    Diabetes Mother    Breast cancer Mother    High blood pressure (Hypertension) Father    Stroke Maternal Grandmother        Social History   Tobacco Use Smoking Status Current Every Day Smoker Smokeless Tobacco Never Used     Social History Social History    Socioeconomic  History  Marital status: Unknown Tobacco Use  Smoking status: Current Every Day Smoker  Smokeless tobacco: Never Used Substance and Sexual Activity  Alcohol use: Yes     Comment: beer/wine a glass daily  Drug use: Never      Objective:   Vitals:   04/06/21 1038 BP: 130/88 Pulse: 89 Temp: 36.7 C (98.1 F) SpO2: 100% Weight: 99.2 kg (218 lb 12.8 oz) Height: 165.1 cm (5\' 5" )   Body mass index is 36.41 kg/m.   Physical Exam Constitutional:      Appearance: Normal appearance.  HENT:     Head: Normocephalic and atraumatic.  Pulmonary:     Effort: Pulmonary effort is normal.  Abdominal:     Comments: Focal tenderness and guarding in epigastrium and RUQ  Musculoskeletal:        General: Normal range of motion.     Cervical back: Normal range of motion.  Neurological:     General: No focal deficit present.     Mental Status: She is alert and oriented to person, place, and time. Mental status is at baseline.  Psychiatric:        Mood and Affect: Mood normal.        Behavior: Behavior normal.        Thought Content: Thought content normal.      Labs, Imaging and Diagnostic Testing: Reviewed Dr.  Glick's notes of ED visit Abdominal/Pelvis CT results were reviewed showing gallstones   Assessment and Plan: Diagnoses and all orders for this visit:   Calculus of gallbladder with chronic cholecystitis without obstruction     Kristina Castro is a 45 y.o. female with evidence of gallbladder disease. She has persistent pain and I think she needs an urgent cholecystectomy. We discussed the etiology of gallstones and biliary disease and that can cause pain.  We discussed exacerbating factors including fatty meals.  We discussed the details of surgery for removal of the gallbladder including general anesthesia, 4 small incisions in the patient's abdomen, removal of the patient's gallbladder with the liver and common bile duct, and most likely an outpatient procedure.  We discussed  risks of common bile duct injury, cystic duct stump leak, injury to liver, bleeding, infection, need for open procedure, and post cholecystectomy syndrome. She showed good understanding and wanted to proceed with laparoscopic cholecystectomy.    We will plan to arrange for direct admit for urgent cholecystectomy.   No follow-ups on file.   Mickeal Skinner, MD

## 2021-04-06 NOTE — Anesthesia Preprocedure Evaluation (Addendum)
Anesthesia Evaluation  Patient identified by MRN, date of birth, ID band Patient awake    Reviewed: Allergy & Precautions, NPO status , Patient's Chart, lab work & pertinent test results  Airway Mallampati: II  TM Distance: >3 FB Neck ROM: Full    Dental no notable dental hx. (+) Teeth Intact, Dental Advisory Given   Pulmonary Current Smoker and Patient abstained from smoking.,    Pulmonary exam normal breath sounds clear to auscultation       Cardiovascular Exercise Tolerance: Good negative cardio ROS Normal cardiovascular exam Rhythm:Regular Rate:Normal     Neuro/Psych negative neurological ROS  negative psych ROS   GI/Hepatic Neg liver ROS, GERD  ,  Endo/Other  negative endocrine ROS  Renal/GU      Musculoskeletal   Abdominal (+) + obese (BMI 35),   Peds  Hematology negative hematology ROS (+) Lab Results      Component                Value               Date                      WBC                      8.5                 04/06/2021                HGB                      12.9                04/06/2021                HCT                      39.7                04/06/2021                MCV                      85.0                04/06/2021                PLT                      295                 04/06/2021              Anesthesia Other Findings All Gadollinium, Adhesives  Reproductive/Obstetrics negative OB ROS                            Anesthesia Physical Anesthesia Plan  ASA: 2  Anesthesia Plan: General   Post-op Pain Management:    Induction: Intravenous  PONV Risk Score and Plan: 3 and Treatment may vary due to age or medical condition, Midazolam and Ondansetron  Airway Management Planned: Oral ETT  Additional Equipment: None  Intra-op Plan:   Post-operative Plan: Extubation in OR  Informed Consent: I have reviewed the patients History and Physical, chart,  labs and discussed the procedure including the risks, benefits  and alternatives for the proposed anesthesia with the patient or authorized representative who has indicated his/her understanding and acceptance.     Dental advisory given  Plan Discussed with: CRNA  Anesthesia Plan Comments: (GA)       Anesthesia Quick Evaluation

## 2021-04-06 NOTE — H&P (View-Only) (Signed)
REFERRING PHYSICIAN:  Self   PROVIDER:  Mickeal Skinner, MD   MRN: [id number] DOB: 11/14/1975 DATE OF ENCOUNTER: 04/06/2021 Subjective   Chief Complaint: Cholelithiasis     History of Present Illness: Kristina Castro is a 45 y.o. female who is seen today as an office consultationor evaluation of Cholelithiasis .     Kristina Castro has had pain located in the RUQ and in the epigastrium. It first started 1 week. It occurs several times a week. It is worsened with activity. She has nausea and vomiting. She has had decreased frequency of stools.   Review of Systems: A complete review of systems was obtained from the patient.  I have reviewed this information and discussed as appropriate with the patient.  See HPI as well for other ROS.   ROS    Medical History: Past Medical History Past Medical History: Diagnosis Date  GERD (gastroesophageal reflux disease)        There is no problem list on file for this patient.     Past Surgical History Past Surgical History: Procedure Laterality Date  HYSTERECTOMY          Allergies Allergies Allergen Reactions  Adhesive Tape-Silicones Rash     All tapes including paper tape  Gadolinium-Containing Contrast Media Nausea And Vomiting     Pt vomited after multihance injection.       Current Outpatient Medications on File Prior to Visit Medication Sig Dispense Refill  HYDROcodone-acetaminophen (NORCO) 5-325 mg tablet Take by mouth      ondansetron (ZOFRAN) 4 MG tablet Take by mouth       No current facility-administered medications on file prior to visit.     Family History Family History Problem Relation Age of Onset  Obesity Mother    Diabetes Mother    Breast cancer Mother    High blood pressure (Hypertension) Father    Stroke Maternal Grandmother        Social History   Tobacco Use Smoking Status Current Every Day Smoker Smokeless Tobacco Never Used     Social History Social History    Socioeconomic  History  Marital status: Unknown Tobacco Use  Smoking status: Current Every Day Smoker  Smokeless tobacco: Never Used Substance and Sexual Activity  Alcohol use: Yes     Comment: beer/wine a glass daily  Drug use: Never      Objective:   Vitals:   04/06/21 1038 BP: 130/88 Pulse: 89 Temp: 36.7 C (98.1 F) SpO2: 100% Weight: 99.2 kg (218 lb 12.8 oz) Height: 165.1 cm (5\' 5" )   Body mass index is 36.41 kg/m.   Physical Exam Constitutional:      Appearance: Normal appearance.  HENT:     Head: Normocephalic and atraumatic.  Pulmonary:     Effort: Pulmonary effort is normal.  Abdominal:     Comments: Focal tenderness and guarding in epigastrium and RUQ  Musculoskeletal:        General: Normal range of motion.     Cervical back: Normal range of motion.  Neurological:     General: No focal deficit present.     Mental Status: She is alert and oriented to person, place, and time. Mental status is at baseline.  Psychiatric:        Mood and Affect: Mood normal.        Behavior: Behavior normal.        Thought Content: Thought content normal.      Labs, Imaging and Diagnostic Testing: Reviewed Dr.  Glick's notes of ED visit Abdominal/Pelvis CT results were reviewed showing gallstones   Assessment and Plan: Diagnoses and all orders for this visit:   Calculus of gallbladder with chronic cholecystitis without obstruction     Kristina Castro is a 45 y.o. female with evidence of gallbladder disease. She has persistent pain and I think she needs an urgent cholecystectomy. We discussed the etiology of gallstones and biliary disease and that can cause pain.  We discussed exacerbating factors including fatty meals.  We discussed the details of surgery for removal of the gallbladder including general anesthesia, 4 small incisions in the patient's abdomen, removal of the patient's gallbladder with the liver and common bile duct, and most likely an outpatient procedure.  We discussed  risks of common bile duct injury, cystic duct stump leak, injury to liver, bleeding, infection, need for open procedure, and post cholecystectomy syndrome. She showed good understanding and wanted to proceed with laparoscopic cholecystectomy.    We will plan to arrange for direct admit for urgent cholecystectomy.   No follow-ups on file.   Mickeal Skinner, MD

## 2021-04-07 ENCOUNTER — Other Ambulatory Visit: Payer: Self-pay

## 2021-04-07 ENCOUNTER — Inpatient Hospital Stay (HOSPITAL_COMMUNITY): Payer: Medicaid Other | Admitting: Anesthesiology

## 2021-04-07 ENCOUNTER — Encounter (HOSPITAL_COMMUNITY): Payer: Self-pay | Admitting: General Surgery

## 2021-04-07 ENCOUNTER — Encounter (HOSPITAL_COMMUNITY): Admission: AD | Disposition: A | Discharge status: Home / Self Care | Payer: Self-pay | Source: Ambulatory Visit

## 2021-04-07 HISTORY — PX: CHOLECYSTECTOMY: SHX55

## 2021-04-07 LAB — COMPREHENSIVE METABOLIC PANEL
ALT: 17 U/L (ref 0–44)
AST: 13 U/L — ABNORMAL LOW (ref 15–41)
Albumin: 3.5 g/dL (ref 3.5–5.0)
Alkaline Phosphatase: 83 U/L (ref 38–126)
Anion gap: 7 (ref 5–15)
BUN: 9 mg/dL (ref 6–20)
CO2: 28 mmol/L (ref 22–32)
Calcium: 8.8 mg/dL — ABNORMAL LOW (ref 8.9–10.3)
Chloride: 104 mmol/L (ref 98–111)
Creatinine, Ser: 0.58 mg/dL (ref 0.44–1.00)
GFR, Estimated: >60 mL/min (ref >60)
Glucose, Bld: 133 mg/dL — ABNORMAL HIGH (ref 70–99)
Potassium: 3.9 mmol/L (ref 3.5–5.1)
Sodium: 139 mmol/L (ref 135–145)
Total Bilirubin: 0.5 mg/dL (ref 0.3–1.2)
Total Protein: 6.7 g/dL (ref 6.5–8.1)

## 2021-04-07 LAB — CBC
HCT: 39.8 % (ref 36.0–46.0)
Hemoglobin: 12.8 g/dL (ref 12.0–15.0)
MCH: 27.4 pg (ref 26.0–34.0)
MCHC: 32.2 g/dL (ref 30.0–36.0)
MCV: 85 fL (ref 80.0–100.0)
Platelets: 260 10*3/uL (ref 150–400)
RBC: 4.68 MIL/uL (ref 3.87–5.11)
RDW: 12.3 % (ref 11.5–15.5)
WBC: 8.1 10*3/uL (ref 4.0–10.5)
nRBC: 0 % (ref 0.0–0.2)

## 2021-04-07 LAB — SARS CORONAVIRUS 2 BY RT PCR (HOSPITAL ORDER, PERFORMED IN ~~LOC~~ HOSPITAL LAB): SARS Coronavirus 2: NEGATIVE

## 2021-04-07 SURGERY — LAPAROSCOPIC CHOLECYSTECTOMY WITH INTRAOPERATIVE CHOLANGIOGRAM
Anesthesia: General | Site: Abdomen

## 2021-04-07 MED ORDER — FENTANYL CITRATE (PF) 100 MCG/2ML IJ SOLN
INTRAMUSCULAR | Status: DC | PRN
  Administered 2021-04-07 (×7): 50 ug via INTRAVENOUS

## 2021-04-07 MED ORDER — PROPOFOL 10 MG/ML IV BOLUS
INTRAVENOUS | Status: DC | PRN
  Administered 2021-04-07: 140 mg via INTRAVENOUS

## 2021-04-07 MED ORDER — LACTATED RINGERS IV SOLN
INTRAVENOUS | Status: AC | PRN
  Administered 2021-04-07: 1000 mL

## 2021-04-07 MED ORDER — ROCURONIUM BROMIDE 10 MG/ML (PF) SYRINGE
PREFILLED_SYRINGE | INTRAVENOUS | Status: AC
  Filled 2021-04-07: qty 10

## 2021-04-07 MED ORDER — ONDANSETRON HCL 4 MG/2ML IJ SOLN
INTRAMUSCULAR | Status: AC
  Filled 2021-04-07: qty 2

## 2021-04-07 MED ORDER — LACTATED RINGERS IV SOLN: INTRAVENOUS | Status: DC

## 2021-04-07 MED ORDER — BUPIVACAINE-EPINEPHRINE 0.25% -1:200000 IJ SOLN
INTRAMUSCULAR | Status: DC | PRN
  Administered 2021-04-07: 25 mL

## 2021-04-07 MED ORDER — ROCURONIUM BROMIDE 100 MG/10ML IV SOLN
INTRAVENOUS | Status: DC | PRN
Start: 2021-04-07 — End: 2021-04-07
  Administered 2021-04-07: 60 mg via INTRAVENOUS

## 2021-04-07 MED ORDER — OXYCODONE HCL 5 MG PO TABS: 5.0000 mg | ORAL_TABLET | Freq: Once | ORAL | Status: AC | PRN

## 2021-04-07 MED ORDER — 0.9 % SODIUM CHLORIDE (POUR BTL) OPTIME
TOPICAL | Status: DC | PRN
  Administered 2021-04-07: 1000 mL

## 2021-04-07 MED ORDER — HYDROMORPHONE HCL 1 MG/ML IJ SOLN
INTRAMUSCULAR | Status: AC
  Administered 2021-04-07: 0.5 mg via INTRAVENOUS
  Filled 2021-04-07: qty 1

## 2021-04-07 MED ORDER — BUPIVACAINE-EPINEPHRINE 0.25% -1:200000 IJ SOLN
INTRAMUSCULAR | Status: AC
  Filled 2021-04-07: qty 1

## 2021-04-07 MED ORDER — DEXMEDETOMIDINE HCL 200 MCG/2ML IV SOLN
INTRAVENOUS | Status: DC | PRN
  Administered 2021-04-07: 4 ug via INTRAVENOUS
  Administered 2021-04-07: 8 ug via INTRAVENOUS

## 2021-04-07 MED ORDER — SUGAMMADEX SODIUM 200 MG/2ML IV SOLN
INTRAVENOUS | Status: DC | PRN
  Administered 2021-04-07: 300 mg via INTRAVENOUS

## 2021-04-07 MED ORDER — DEXAMETHASONE SODIUM PHOSPHATE 10 MG/ML IJ SOLN
INTRAMUSCULAR | Status: AC
  Filled 2021-04-07: qty 2

## 2021-04-07 MED ORDER — PROPOFOL 10 MG/ML IV BOLUS
INTRAVENOUS | Status: AC
  Filled 2021-04-07: qty 20

## 2021-04-07 MED ORDER — HYDROMORPHONE HCL 1 MG/ML IJ SOLN
0.2500 mg | INTRAMUSCULAR | Status: DC | PRN
  Administered 2021-04-07: 0.5 mg via INTRAVENOUS

## 2021-04-07 MED ORDER — DEXTROSE-NACL 5-0.45 % IV SOLN: INTRAVENOUS | Status: DC

## 2021-04-07 MED ORDER — IBUPROFEN 400 MG PO TABS: 600.0000 mg | ORAL_TABLET | Freq: Four times a day (QID) | ORAL | Status: DC | PRN

## 2021-04-07 MED ORDER — ONDANSETRON HCL 4 MG/2ML IJ SOLN
INTRAMUSCULAR | Status: DC | PRN
  Administered 2021-04-07: 4 mg via INTRAVENOUS

## 2021-04-07 MED ORDER — KETOROLAC TROMETHAMINE 30 MG/ML IJ SOLN: 30.0000 mg | Freq: Once | INTRAMUSCULAR | Status: AC | PRN

## 2021-04-07 MED ORDER — ONDANSETRON HCL 4 MG/2ML IJ SOLN: 4.0000 mg | Freq: Once | INTRAMUSCULAR | Status: DC | PRN

## 2021-04-07 MED ORDER — DEXAMETHASONE SODIUM PHOSPHATE 10 MG/ML IJ SOLN
INTRAMUSCULAR | Status: DC | PRN
  Administered 2021-04-07: 10 mg via INTRAVENOUS

## 2021-04-07 MED ORDER — MIDAZOLAM HCL 2 MG/2ML IJ SOLN
INTRAMUSCULAR | Status: AC
  Filled 2021-04-07: qty 2

## 2021-04-07 MED ORDER — FENTANYL CITRATE (PF) 250 MCG/5ML IJ SOLN
INTRAMUSCULAR | Status: AC
  Filled 2021-04-07: qty 5

## 2021-04-07 MED ORDER — AMISULPRIDE (ANTIEMETIC) 5 MG/2ML IV SOLN: 10.0000 mg | Freq: Once | INTRAVENOUS | Status: DC | PRN

## 2021-04-07 MED ORDER — MIDAZOLAM HCL 5 MG/5ML IJ SOLN
INTRAMUSCULAR | Status: DC | PRN
  Administered 2021-04-07 (×2): 1 mg via INTRAVENOUS

## 2021-04-07 MED ORDER — ESMOLOL HCL 100 MG/10ML IV SOLN
INTRAVENOUS | Status: DC | PRN
  Administered 2021-04-07: 20 ug via INTRAVENOUS

## 2021-04-07 MED ORDER — LIDOCAINE 2% (20 MG/ML) 5 ML SYRINGE
INTRAMUSCULAR | Status: AC
  Filled 2021-04-07: qty 5

## 2021-04-07 MED ORDER — KETOROLAC TROMETHAMINE 30 MG/ML IJ SOLN
INTRAMUSCULAR | Status: AC
  Administered 2021-04-07: 30 mg via INTRAVENOUS
  Filled 2021-04-07: qty 1

## 2021-04-07 MED ORDER — FENTANYL CITRATE (PF) 100 MCG/2ML IJ SOLN
INTRAMUSCULAR | Status: AC
  Filled 2021-04-07: qty 2

## 2021-04-07 MED ORDER — OXYCODONE HCL 5 MG PO TABS
ORAL_TABLET | ORAL | Status: AC
  Administered 2021-04-07: 5 mg via ORAL
  Filled 2021-04-07: qty 1

## 2021-04-07 MED ORDER — ESMOLOL HCL 100 MG/10ML IV SOLN
INTRAVENOUS | Status: AC
  Filled 2021-04-07: qty 10

## 2021-04-07 MED ORDER — LIDOCAINE HCL (CARDIAC) PF 100 MG/5ML IV SOSY
PREFILLED_SYRINGE | INTRAVENOUS | Status: DC | PRN
  Administered 2021-04-07: 100 mg via INTRAVENOUS

## 2021-04-07 MED ORDER — OXYCODONE HCL 5 MG/5ML PO SOLN: 5.0000 mg | Freq: Once | ORAL | Status: AC | PRN

## 2021-04-07 SURGICAL SUPPLY — 37 items
ADH SKN CLS APL DERMABOND .7 (GAUZE/BANDAGES/DRESSINGS) ×1
APL PRP STRL LF DISP 70% ISPRP (MISCELLANEOUS) ×1
APPLIER CLIP 5 13 M/L LIGAMAX5 (MISCELLANEOUS) ×2
APR CLP MED LRG 5 ANG JAW (MISCELLANEOUS) ×1
BAG COUNTER SPONGE SURGICOUNT (BAG) IMPLANT
BAG SPEC RTRVL LRG 6X4 10 (ENDOMECHANICALS) ×1
BAG SPNG CNTER NS LX DISP (BAG)
CABLE HIGH FREQUENCY MONO STRZ (ELECTRODE) ×2 IMPLANT
CATH REDDICK CHOLANGI 4FR 50CM (CATHETERS) ×2 IMPLANT
CHLORAPREP W/TINT 26 (MISCELLANEOUS) ×2 IMPLANT
CLIP APPLIE 5 13 M/L LIGAMAX5 (MISCELLANEOUS) ×1 IMPLANT
COVER MAYO STAND STRL (DRAPES) ×2 IMPLANT
DECANTER SPIKE VIAL GLASS SM (MISCELLANEOUS) ×2 IMPLANT
DERMABOND ADVANCED (GAUZE/BANDAGES/DRESSINGS) ×1
DERMABOND ADVANCED .7 DNX12 (GAUZE/BANDAGES/DRESSINGS) ×1 IMPLANT
DRAPE C-ARM 42X120 X-RAY (DRAPES) ×2 IMPLANT
ELECT REM PT RETURN 15FT ADLT (MISCELLANEOUS) ×2 IMPLANT
GLOVE SURG ENC MOIS LTX SZ7.5 (GLOVE) ×2 IMPLANT
GOWN STRL REUS W/TWL XL LVL3 (GOWN DISPOSABLE) ×6 IMPLANT
HEMOSTAT SURGICEL 4X8 (HEMOSTASIS) IMPLANT
IV CATH 14GX2 1/4 (CATHETERS) ×2 IMPLANT
KIT BASIN OR (CUSTOM PROCEDURE TRAY) ×2 IMPLANT
KIT TURNOVER KIT A (KITS) ×2 IMPLANT
PENCIL SMOKE EVACUATOR (MISCELLANEOUS) IMPLANT
POUCH SPECIMEN RETRIEVAL 10MM (ENDOMECHANICALS) ×2 IMPLANT
SCISSORS LAP 5X35 DISP (ENDOMECHANICALS) ×2 IMPLANT
SET IRRIG TUBING LAPAROSCOPIC (IRRIGATION / IRRIGATOR) ×2 IMPLANT
SET TUBE SMOKE EVAC HIGH FLOW (TUBING) ×2 IMPLANT
SLEEVE XCEL OPT CAN 5 100 (ENDOMECHANICALS) ×4 IMPLANT
SOL ANTI FOG 6CC (MISCELLANEOUS) IMPLANT
SOLUTION ANTI FOG 6CC (MISCELLANEOUS) ×1
SUT MNCRL AB 4-0 PS2 18 (SUTURE) ×2 IMPLANT
TOWEL OR 17X26 10 PK STRL BLUE (TOWEL DISPOSABLE) ×2 IMPLANT
TOWEL OR NON WOVEN STRL DISP B (DISPOSABLE) ×2 IMPLANT
TRAY LAPAROSCOPIC (CUSTOM PROCEDURE TRAY) ×2 IMPLANT
TROCAR BLADELESS OPT 5 100 (ENDOMECHANICALS) ×2 IMPLANT
TROCAR XCEL BLUNT TIP 100MML (ENDOMECHANICALS) ×2 IMPLANT

## 2021-04-07 NOTE — Plan of Care (Signed)

## 2021-04-07 NOTE — Anesthesia Postprocedure Evaluation (Signed)
Anesthesia Post Note  Patient: Kristina Castro  Procedure(s) Performed: LAPAROSCOPIC CHOLECYSTECTOMY (Abdomen)     Patient location during evaluation: PACU Anesthesia Type: General Level of consciousness: awake and alert Pain management: pain level controlled Vital Signs Assessment: post-procedure vital signs reviewed and stable Respiratory status: spontaneous breathing, nonlabored ventilation, respiratory function stable and patient connected to nasal cannula oxygen Cardiovascular status: blood pressure returned to baseline and stable Postop Assessment: no apparent nausea or vomiting Anesthetic complications: no   No notable events documented.  Last Vitals:  Vitals:   04/07/21 0915 04/07/21 0930  BP: 134/90 136/80  Pulse: 91 80  Resp: 12 12  Temp:  36.6 C  SpO2: 100% 95%    Last Pain:  Vitals:   04/07/21 0930  TempSrc:   PainSc: 7                  Barnet Glasgow

## 2021-04-07 NOTE — Anesthesia Procedure Notes (Signed)
Procedure Name: Intubation Date/Time: 04/07/2021 7:46 AM Performed by: Garrel Ridgel, CRNA Pre-anesthesia Checklist: Patient identified, Emergency Drugs available, Suction available and Patient being monitored Patient Re-evaluated:Patient Re-evaluated prior to induction Oxygen Delivery Method: Circle system utilized Preoxygenation: Pre-oxygenation with 100% oxygen Induction Type: IV induction Ventilation: Mask ventilation without difficulty Laryngoscope Size: Mac and 3 Grade View: Grade II Tube type: Oral Tube size: 7.0 mm Number of attempts: 1 Airway Equipment and Method: Stylet and Oral airway Placement Confirmation: ETT inserted through vocal cords under direct vision, positive ETCO2 and breath sounds checked- equal and bilateral Secured at: 22 cm Tube secured with: Tape Dental Injury: Teeth and Oropharynx as per pre-operative assessment

## 2021-04-07 NOTE — Interval H&P Note (Signed)
History and Physical Interval Note:  04/07/2021 7:06 AM  Kristina Castro  has presented today for surgery, with the diagnosis of ACUTE CHOLECYSTITIS.  The various methods of treatment have been discussed with the patient and family. After consideration of risks, benefits and other options for treatment, the patient has consented to  Procedure(s): LAPAROSCOPIC CHOLECYSTECTOMY WITH INTRAOPERATIVE CHOLANGIOGRAM (N/A) as a surgical intervention.  The patient's history has been reviewed, patient examined, no change in status, stable for surgery.  I have reviewed the patient's chart and labs.  Questions were answered to the patient's satisfaction.     [name]

## 2021-04-07 NOTE — Op Note (Signed)
04/06/2021 - 04/07/2021  8:30 AM  PATIENT:  Kristina Castro  45 y.o. female  PRE-OPERATIVE DIAGNOSIS:  GALLSTONES  POST-OPERATIVE DIAGNOSIS:  GALLSTONES  PROCEDURE:  Procedure(s): LAPAROSCOPIC CHOLECYSTECTOMY (N/A)  SURGEON:  Surgeon(s) and Role:    * Jovita Kussmaul, MD - Primary  PHYSICIAN ASSISTANT:   ASSISTANTS: Judyann Munson, RNFA   ANESTHESIA:   local and general  EBL:  minimal   BLOOD ADMINISTERED:none  DRAINS: none   LOCAL MEDICATIONS USED:  MARCAINE     SPECIMEN:  Source of Specimen:  gallbladder  DISPOSITION OF SPECIMEN:  PATHOLOGY  COUNTS:  YES  TOURNIQUET:  * No tourniquets in log *  DICTATION: .Dragon Dictation    Procedure: After informed consent was obtained the patient was brought to the operating room and placed in the supine position on the operating room table. After adequate induction of general anesthesia the patient's abdomen was prepped with ChloraPrep allowed to dry and draped in usual sterile manner. An appropriate timeout was performed. The area above the umbilicus was infiltrated with quarter percent  Marcaine. A small incision was made with a 15 blade knife. The incision was carried down through the subcutaneous tissue bluntly with a hemostat and [employer] retractors. The linea alba was identified. The linea alba was incised with a 15 blade knife and each side was grasped with Coker clamps. The preperitoneal space was then probed with a hemostat until the peritoneum was opened and access was gained to the abdominal cavity. A 0 Vicryl pursestring stitch was placed in the fascia surrounding the opening. A Hassan cannula was then placed through the opening and anchored in place with the previously placed Vicryl purse string stitch. The abdomen was insufflated with carbon dioxide without difficulty. A laparoscope was inserted through the [facility] cannula in the right upper quadrant was inspected. Next the epigastric region was infiltrated with %  Marcaine. A small incision was made with a 15 blade knife. A 5 mm port was placed bluntly through this incision into the abdominal cavity under direct vision. Next 2 sites were chosen laterally on the right side of the abdomen for placement of 5 mm ports. Each of these areas was infiltrated with quarter percent Marcaine. Small stab incisions were made with a 15 blade knife. 5 mm ports were then placed bluntly through these incisions into the abdominal cavity under direct vision without difficulty. A blunt grasper was placed through the lateralmost 5 mm port and used to grasp the dome of the gallbladder and elevated anteriorly and superiorly. Another blunt grasper was placed through the other 5 mm port and used to retract the body and neck of the gallbladder. A dissector was placed through the epigastric port and using the electrocautery the peritoneal reflection at the gallbladder neck was opened. Blunt dissection was then carried out in this area until the gallbladder neck-cystic duct junction was readily identified and a good critical window was created. A single clip was placed on the gallbladder neck and 2 clips were placed proximally on the cystic duct and the duct was divided between the 2 sets of clips. Posterior to this the cystic artery was identified and again dissected bluntly in a circumferential manner until a good window  was created. 2 clips were placed proximally and one distally on the artery and the artery was divided between the 2 sets of clips. Next a laparoscopic hook cautery device was used to separate the gallbladder from the liver bed. Prior to completely detaching the gallbladder from  the liver bed the liver bed was inspected and several small bleeding points were coagulated with the electrocautery until the area was completely hemostatic. The gallbladder was then detached the rest of it from the liver bed without difficulty. A laparoscopic bag was inserted through the hassan port. The  laparoscope was moved to the epigastric port. The gallbladder was placed within the bag and the bag was sealed.  The bag with the gallbladder was then removed with the [facility] cannula through the infraumbilical port without difficulty. The fascial defect was then closed with the previously placed Vicryl pursestring stitch as well as with another figure-of-eight 0 Vicryl stitch. The liver bed was inspected again and found to be hemostatic. The abdomen was irrigated with copious amounts of saline until the effluent was clear. The ports were then removed under direct vision without difficulty and were found to be hemostatic. The gas was allowed to escape. The skin incisions were all closed with interrupted 4-0 Monocryl subcuticular stitches. Dermabond dressings were applied. The patient tolerated the procedure well. At the end of the case all needle sponge and instrument counts were correct. The patient was then awakened and taken to recovery in stable condition   PLAN OF CARE: Admit for overnight observation  PATIENT DISPOSITION:  PACU - hemodynamically stable.   Delay start of Pharmacological VTE agent (>24hrs) due to surgical blood loss or risk of bleeding: no

## 2021-04-07 NOTE — Transfer of Care (Signed)
Immediate Anesthesia Transfer of Care Note  Patient: Kristina Castro  Procedure(s) Performed: LAPAROSCOPIC CHOLECYSTECTOMY (Abdomen)  Patient Location: PACU  Anesthesia Type:General  Level of Consciousness: oriented, drowsy and patient cooperative  Airway & Oxygen Therapy: Patient Spontanous Breathing and Patient connected to face mask oxygen  Post-op Assessment: Report given to RN and Post -op Vital signs reviewed and stable  Post vital signs: Reviewed and stable  Last Vitals:  Vitals Value Taken Time  BP 133/78 04/07/21 0852  Temp    Pulse 100 04/07/21 0853  Resp 16 04/07/21 0853  SpO2 100 % 04/07/21 0853  Vitals shown include unvalidated device data.  Last Pain:  Vitals:   04/07/21 0552  TempSrc: Oral  PainSc:       Patients Stated Pain Goal: 3 (93/23/55 7322)  Complications: No notable events documented.

## 2021-04-08 ENCOUNTER — Encounter (HOSPITAL_COMMUNITY): Payer: Self-pay | Admitting: General Surgery

## 2021-04-08 LAB — SURGICAL PATHOLOGY

## 2021-04-08 LAB — COMPREHENSIVE METABOLIC PANEL
ALT: 33 U/L (ref 0–44)
AST: 29 U/L (ref 15–41)
Albumin: 3.4 g/dL — ABNORMAL LOW (ref 3.5–5.0)
Alkaline Phosphatase: 88 U/L (ref 38–126)
Anion gap: 6 (ref 5–15)
BUN: 13 mg/dL (ref 6–20)
CO2: 25 mmol/L (ref 22–32)
Calcium: 9.1 mg/dL (ref 8.9–10.3)
Chloride: 107 mmol/L (ref 98–111)
Creatinine, Ser: 0.63 mg/dL (ref 0.44–1.00)
GFR, Estimated: >60 mL/min (ref >60)
Glucose, Bld: 148 mg/dL — ABNORMAL HIGH (ref 70–99)
Potassium: 4.3 mmol/L (ref 3.5–5.1)
Sodium: 138 mmol/L (ref 135–145)
Total Bilirubin: 0.3 mg/dL (ref 0.3–1.2)
Total Protein: 6.8 g/dL (ref 6.5–8.1)

## 2021-04-08 LAB — CBC
HCT: 40 % (ref 36.0–46.0)
Hemoglobin: 12.9 g/dL (ref 12.0–15.0)
MCH: 27.6 pg (ref 26.0–34.0)
MCHC: 32.3 g/dL (ref 30.0–36.0)
MCV: 85.5 fL (ref 80.0–100.0)
Platelets: 299 10*3/uL (ref 150–400)
RBC: 4.68 MIL/uL (ref 3.87–5.11)
RDW: 12.4 % (ref 11.5–15.5)
WBC: 17 10*3/uL — ABNORMAL HIGH (ref 4.0–10.5)
nRBC: 0 % (ref 0.0–0.2)

## 2021-04-08 MED ORDER — OXYCODONE HCL 5 MG PO TABS: 5.0000 mg | ORAL_TABLET | Freq: Four times a day (QID) | ORAL | 0 refills | Status: DC | PRN

## 2021-04-08 NOTE — Discharge Summary (Signed)
Patient ID: [name] [id number] 1976/01/16 45 y.o.  Admit date: 04/06/2021 Discharge date: 04/08/2021  Admitting Diagnosis: Calculus of gallbladder with chronic cholecystitis without obstruction  Discharge Diagnosis Patient Active Problem List   Diagnosis Date Noted   Acute calculous cholecystitis 04/06/2021   Cervicalgia 06/17/2018   Acute pain of right shoulder 06/17/2018   Postoperative intra-abdominal abscess    Pelvic abscess in female    Pelvic mass in female 06/13/2015   S/P hysterectomy 05/25/2015   Abdominal pain 05/11/2013    Consultants None  HPI:  Kristina Castro is a 45 y.o. female who is seen today as an office consultationor evaluation of Cholelithiasis .     Kristina Castro has had pain located in the RUQ and in the epigastrium. It first started 1 week. It occurs several times a week. It is worsened with activity. She has nausea and vomiting. She has had decreased frequency of stools.  Procedures Dr. Marlou Starks - Laparoscopic Cholecystectomy - 04/07/21  Hospital Course:  Patient direct admitted from the office for cholelithiasis with suspected cholecystitis. She underwent a laparoscopic cholecystectomy by Dr. Marlou Starks on 7/21. The patient tolerated the procedure well.  POD 1 LFT's reassuring.  On POD 1, the patient was tolerating a regular diet, voiding well, mobilizing, and pain was controlled with oral pain medications.  The patient was stable for DC home at this time with appropriate follow up made.  Discharge and return precautions were discussed.  Physical Exam: Gen:  Alert, NAD, pleasant HEENT: EOM's intact, pupils equal and round Card:  RRR, no M/G/R heard Pulm:  CTAB, no W/R/R, effort normal Abd: Soft, ND, appropriately tender around incisions - otherwise NT, +BS, Incisions with glue intact appears well and are without drainage, bleeding, or signs of infection Ext:  No LE edema Psych: A&Ox3  Skin: no rashes noted, warm and dry   Allergies as of  04/08/2021       Reactions   Adhesive [tape] Rash   All tapes including paper tape   Gadolinium Derivatives Nausea And Vomiting   Pt vomited after multihance injection.         Medication List     STOP taking these medications    HYDROcodone-acetaminophen 5-325 MG tablet Commonly known as: NORCO/VICODIN       TAKE these medications    acyclovir 800 MG tablet Commonly known as: ZOVIRAX Take 800 mg by mouth daily.   BIOTIN PO Take 1 tablet by mouth daily.   multivitamin with minerals Tabs tablet Take 1 tablet by mouth daily.   ondansetron 4 MG tablet Commonly known as: ZOFRAN Take 1 tablet (4 mg total) by mouth every 6 (six) hours as needed for nausea or vomiting.   OVER THE COUNTER MEDICATION Take 1 tablet by mouth daily. Sea moss   oxyCODONE 5 MG immediate release tablet Commonly known as: Oxy IR/ROXICODONE Take 1 tablet (5 mg total) by mouth every 6 (six) hours as needed for breakthrough pain.          Follow-up Information     [facility] [place] Follow up on 05/05/2021.   Specialty: General Surgery Why: Your appointment is at 1:30 PM.  Be at the office 30 minutes early for check in.  Bring photo ID and insurance information . Contact information: [address] [place] [address] [phone]                 Signed: Alferd Apa, [facility] 04/08/2021,  8:54 AM Please see Amion for pager number during day hours 7:00am-4:30pm

## 2021-04-08 NOTE — Discharge Instructions (Signed)
[facility]  Please arrive at least 30 min before your appointment to complete your check in paperwork.  If you are unable to arrive 30 min prior to your appointment time we may have to cancel or reschedule you. LAPAROSCOPIC SURGERY: POST OP INSTRUCTIONS Always review your discharge instruction sheet given to you by the facility where your surgery was performed. IF YOU HAVE DISABILITY OR FAMILY LEAVE FORMS, YOU MUST BRING THEM TO THE OFFICE FOR PROCESSING.   DO NOT GIVE THEM TO YOUR DOCTOR.  PAIN CONTROL  First take acetaminophen (Tylenol) AND/or ibuprofen (Advil) to control your pain after surgery.  Follow directions on package.  Taking acetaminophen (Tylenol) and/or ibuprofen (Advil) regularly after surgery will help to control your pain and lower the amount of prescription pain medication you may need.  You should not take more than 4,000 mg (4 grams) of acetaminophen (Tylenol) in 24 hours.  You should not take ibuprofen (Advil), aleve, motrin, naprosyn or other NSAIDS if you have a history of stomach ulcers or chronic kidney disease.  A prescription for pain medication may be given to you upon discharge.  Take your pain medication as prescribed, if you still have uncontrolled pain after taking acetaminophen (Tylenol) or ibuprofen (Advil). Use ice packs to help control pain. If you need a refill on your pain medication, please contact your pharmacy.  They will contact our office to request authorization. Prescriptions will not be filled after 5pm or on week-ends.  HOME MEDICATIONS Take your usually prescribed medications unless otherwise directed.  DIET You should follow a light diet the first few days after arrival home.  Be sure to include lots of fluids daily. Avoid fatty, fried foods.   CONSTIPATION It is common to experience some constipation after surgery and if you are taking pain medication.  Increasing fluid intake and taking a stool softener (such as Colace)  will usually help or prevent this problem from occurring.  A mild laxative (Milk of Magnesia or Miralax) should be taken according to package instructions if there are no bowel movements after 48 hours.  WOUND/INCISION CARE Most patients will experience some swelling and bruising in the area of the incisions.  Ice packs will help.  Swelling and bruising can take several days to resolve.  Unless discharge instructions indicate otherwise, follow guidelines below  STERI-STRIPS - you may remove your outer bandages 48 hours after surgery, and you may shower at that time.  You have steri-strips (small skin tapes) in place directly over the incision.  These strips should be left on the skin for 7-10 days.   DERMABOND/SKIN GLUE - you may shower in 24 hours.  The glue will flake off over the next 2-3 weeks. Any sutures or staples will be removed at the office during your follow-up visit.  ACTIVITIES You may resume regular (light) daily activities beginning the next day--such as daily self-care, walking, climbing stairs--gradually increasing activities as tolerated.  You may have sexual intercourse when it is comfortable.  Refrain from any heavy lifting or straining until approved by your doctor. You may drive when you are no longer taking prescription pain medication, you can comfortably wear a seatbelt, and you can safely maneuver your car and apply brakes.  FOLLOW-UP You should see your doctor in the office for a follow-up appointment approximately 2-3 weeks after your surgery.  You should have been given your post-op/follow-up appointment when your surgery was scheduled.  If you did not receive a post-op/follow-up appointment, make sure   that you call for this appointment within a day or two after you arrive home to insure a convenient appointment time.   WHEN TO CALL YOUR DOCTOR: Fever over 101.0 Inability to urinate Continued bleeding from incision. Increased pain, redness, or drainage from the  incision. Increasing abdominal pain  The clinic staff is available to answer your questions during regular business hours.  Please don't hesitate to call and ask to speak to one of the nurses for clinical concerns.  If you have a medical emergency, go to the nearest emergency room or call 911.  A surgeon from [facility] is always on call at the hospital. [address], [address], [place], [place]  [address] ? [address] [address], [place], [place]   [address] [phone] ? [phone] ? FAX [phone]  

## 2021-04-08 NOTE — Progress Notes (Signed)
D/C instructions given to patient. Patient had no questions. NT or writer will wheel patient out once she is dressed and her ride is here  

## 2021-04-20 ENCOUNTER — Other Ambulatory Visit (HOSPITAL_COMMUNITY): Payer: Self-pay | Admitting: General Surgery

## 2021-04-20 ENCOUNTER — Ambulatory Visit (HOSPITAL_BASED_OUTPATIENT_CLINIC_OR_DEPARTMENT_OTHER)
Admission: RE | Admit: 2021-04-20 | Discharge: 2021-04-20 | Disposition: A | Discharge status: Home / Self Care | Payer: Self-pay | Source: Ambulatory Visit | Attending: General Surgery | Admitting: General Surgery

## 2021-04-20 ENCOUNTER — Other Ambulatory Visit: Payer: Self-pay

## 2021-04-20 DIAGNOSIS — R1011 Right upper quadrant pain: Secondary | ICD-10-CM

## 2021-04-20 MED ORDER — IOHEXOL 300 MG/ML  SOLN
100.0000 mL | Freq: Once | INTRAMUSCULAR | Status: AC | PRN
  Administered 2021-04-20: 100 mL via INTRAVENOUS

## 2021-05-07 ENCOUNTER — Emergency Department (HOSPITAL_COMMUNITY): Payer: 59

## 2021-05-07 ENCOUNTER — Emergency Department (HOSPITAL_COMMUNITY)
Admission: EM | Admit: 2021-05-07 | Discharge: 2021-05-07 | Disposition: A | Discharge status: Home / Self Care | Payer: 59 | Attending: Emergency Medicine | Admitting: Emergency Medicine

## 2021-05-07 ENCOUNTER — Encounter (HOSPITAL_COMMUNITY): Payer: Self-pay | Admitting: Emergency Medicine

## 2021-05-07 ENCOUNTER — Other Ambulatory Visit: Payer: Self-pay

## 2021-05-07 DIAGNOSIS — S40021A Contusion of right upper arm, initial encounter: Secondary | ICD-10-CM | POA: Insufficient documentation

## 2021-05-07 DIAGNOSIS — Y92410 Unspecified street and highway as the place of occurrence of the external cause: Secondary | ICD-10-CM | POA: Insufficient documentation

## 2021-05-07 DIAGNOSIS — T07XXXA Unspecified multiple injuries, initial encounter: Secondary | ICD-10-CM

## 2021-05-07 DIAGNOSIS — F17290 Nicotine dependence, other tobacco product, uncomplicated: Secondary | ICD-10-CM | POA: Insufficient documentation

## 2021-05-07 DIAGNOSIS — S60211A Contusion of right wrist, initial encounter: Secondary | ICD-10-CM | POA: Diagnosis not present

## 2021-05-07 DIAGNOSIS — S1093XA Contusion of unspecified part of neck, initial encounter: Secondary | ICD-10-CM | POA: Diagnosis not present

## 2021-05-07 DIAGNOSIS — Z859 Personal history of malignant neoplasm, unspecified: Secondary | ICD-10-CM | POA: Insufficient documentation

## 2021-05-07 DIAGNOSIS — S20229A Contusion of unspecified back wall of thorax, initial encounter: Secondary | ICD-10-CM | POA: Diagnosis not present

## 2021-05-07 DIAGNOSIS — R519 Headache, unspecified: Secondary | ICD-10-CM | POA: Diagnosis not present

## 2021-05-07 DIAGNOSIS — S4991XA Unspecified injury of right shoulder and upper arm, initial encounter: Secondary | ICD-10-CM | POA: Diagnosis present

## 2021-05-07 DIAGNOSIS — R079 Chest pain, unspecified: Secondary | ICD-10-CM | POA: Diagnosis not present

## 2021-05-07 HISTORY — DX: Malignant (primary) neoplasm, unspecified: C80.1

## 2021-05-07 MED ORDER — HYDROCODONE-ACETAMINOPHEN 5-325 MG PO TABS: 1.0000 | ORAL_TABLET | ORAL | 0 refills | Status: DC | PRN

## 2021-05-07 MED ORDER — DIAZEPAM 10 MG PO TABS: 10.0000 mg | ORAL_TABLET | Freq: Three times a day (TID) | ORAL | 0 refills | Status: DC | PRN

## 2021-05-07 MED ORDER — FENTANYL CITRATE (PF) 100 MCG/2ML IJ SOLN
100.0000 ug | Freq: Once | INTRAMUSCULAR | Status: AC
  Administered 2021-05-07: 100 ug via INTRAVENOUS
  Filled 2021-05-07: qty 2

## 2021-05-07 MED ORDER — LORAZEPAM 2 MG/ML IJ SOLN
1.0000 mg | Freq: Once | INTRAMUSCULAR | Status: AC
  Administered 2021-05-07: 1 mg via INTRAVENOUS
  Filled 2021-05-07: qty 1

## 2021-05-07 MED ORDER — IOHEXOL 350 MG/ML SOLN
100.0000 mL | Freq: Once | INTRAVENOUS | Status: AC | PRN
  Administered 2021-05-07: 100 mL via INTRAVENOUS

## 2021-05-07 NOTE — ED Notes (Signed)
Pt given water, ham sandwich, and graham crackers. Pt denies N/V
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
## 2021-05-07 NOTE — Discharge Instructions (Signed)
CT scans and x-ray did not show any serious injury.  You will likely feel some more pain over the next day to 2 then get better.  Rest as needed over the next few days.  Use ice on sore spots for 2 days after that use heat.  We are prescribing a narcotic pain reliever and a muscle relaxer, to use for symptom relief.  Follow-up with your doctor if not better in a few days.

## 2022-05-09 ENCOUNTER — Emergency Department (HOSPITAL_COMMUNITY)
Admission: EM | Admit: 2022-05-09 | Discharge: 2022-05-09 | Disposition: A | Discharge status: Home / Self Care | Payer: 59 | Attending: Emergency Medicine | Admitting: Emergency Medicine

## 2022-05-09 ENCOUNTER — Encounter (HOSPITAL_COMMUNITY): Payer: Self-pay

## 2022-05-09 ENCOUNTER — Other Ambulatory Visit: Payer: Self-pay

## 2022-05-09 DIAGNOSIS — N644 Mastodynia: Secondary | ICD-10-CM | POA: Insufficient documentation

## 2022-05-09 DIAGNOSIS — N6312 Unspecified lump in the right breast, upper inner quadrant: Secondary | ICD-10-CM | POA: Insufficient documentation

## 2022-05-09 DIAGNOSIS — I10 Essential (primary) hypertension: Secondary | ICD-10-CM | POA: Insufficient documentation

## 2022-05-09 LAB — CBC WITH DIFFERENTIAL/PLATELET
Abs Immature Granulocytes: 0.03 10*3/uL (ref 0.00–0.07)
Basophils Absolute: 0.1 10*3/uL (ref 0.0–0.1)
Basophils Relative: 1 %
Eosinophils Absolute: 0.2 10*3/uL (ref 0.0–0.5)
Eosinophils Relative: 2 %
HCT: 41.8 % (ref 36.0–46.0)
Hemoglobin: 13.7 g/dL (ref 12.0–15.0)
Immature Granulocytes: 0 %
Lymphocytes Relative: 38 %
Lymphs Abs: 3.8 10*3/uL (ref 0.7–4.0)
MCH: 27.6 pg (ref 26.0–34.0)
MCHC: 32.8 g/dL (ref 30.0–36.0)
MCV: 84.1 fL (ref 80.0–100.0)
Monocytes Absolute: 0.4 10*3/uL (ref 0.1–1.0)
Monocytes Relative: 4 %
Neutro Abs: 5.5 10*3/uL (ref 1.7–7.7)
Neutrophils Relative %: 55 %
Platelets: 316 10*3/uL (ref 150–400)
RBC: 4.97 MIL/uL (ref 3.87–5.11)
RDW: 12.8 % (ref 11.5–15.5)
WBC: 9.9 10*3/uL (ref 4.0–10.5)
nRBC: 0 % (ref 0.0–0.2)

## 2022-05-09 LAB — COMPREHENSIVE METABOLIC PANEL
ALT: 17 U/L (ref 0–44)
AST: 18 U/L (ref 15–41)
Albumin: 4.1 g/dL (ref 3.5–5.0)
Alkaline Phosphatase: 97 U/L (ref 38–126)
Anion gap: 11 (ref 5–15)
BUN: 7 mg/dL (ref 6–20)
CO2: 24 mmol/L (ref 22–32)
Calcium: 9.5 mg/dL (ref 8.9–10.3)
Chloride: 106 mmol/L (ref 98–111)
Creatinine, Ser: 0.81 mg/dL (ref 0.44–1.00)
GFR, Estimated: >60 mL/min (ref >60)
Glucose, Bld: 160 mg/dL — ABNORMAL HIGH (ref 70–99)
Potassium: 3.1 mmol/L — ABNORMAL LOW (ref 3.5–5.1)
Sodium: 141 mmol/L (ref 135–145)
Total Bilirubin: 0.5 mg/dL (ref 0.3–1.2)
Total Protein: 7.7 g/dL (ref 6.5–8.1)

## 2022-05-09 MED ORDER — POTASSIUM CHLORIDE CRYS ER 20 MEQ PO TBCR: 20.0000 meq | EXTENDED_RELEASE_TABLET | Freq: Two times a day (BID) | ORAL | 0 refills | Status: DC

## 2022-05-09 NOTE — ED Provider Triage Note (Signed)
Emergency Medicine Provider Triage Evaluation Note  Kristina Castro , a 46 y.o. female  was evaluated in triage.  Pt complains of breast mass to the superior portion of her right breast for hte past 2-3 days. Today, she noted some nipple discharge and has been having pain. Denies any fever.  Review of Systems  Positive:  Negative:   Physical Exam  BP (!) 148/77   Pulse 97   Temp 98.2 F (36.8 C)   Resp 17   Ht '5\' 5"'$  (1.651 m)   Wt 95.3 kg   LMP 05/25/2015   SpO2 97%   BMI 34.95 kg/m  Gen:   Awake, no distress   Resp:  Normal effort  MSK:   Moves extremities without difficulty  Other:    Medical Decision Making  Medically screening exam initiated at 5:52 PM.  Appropriate orders placed.  Para March was informed that the remainder of the evaluation will be completed by another provider, this initial triage assessment does not replace that evaluation, and the importance of remaining in the ED until their evaluation is complete.  Labs ordered   Sherrell Puller, [place] 05/09/22 1754

## 2022-05-09 NOTE — Discharge Instructions (Addendum)
You were seen in the emergency department for a breast mass.  As we discussed, the mass could be a cystic change but is is concerning enough that I think you need a mammogram.  I sent a referral to the breast center, and also attached their contact information for you to call and make an appointment if you have not heard from them by the end of the week.  Your potassium levels were slightly low, and I have prescribed some replacement for you to take for the next couple days.  I recommend having your electrolytes checked again by your primary doctor in the next few weeks.  Continue to monitor how you're doing and return to the ER for new or worsening symptoms.

## 2022-05-09 NOTE — ED Triage Notes (Signed)
Pt reports she noticed a lump in her right breast about 3 days ago. Reports white discharge from her breast today.

## 2022-05-09 NOTE — ED Provider Notes (Signed)
[facility] DEPT Provider Note   CSN: [id number] Arrival date & time: 05/09/22  1713     History  Chief Complaint  Patient presents with   Breast Mass    Kristina Castro is a 46 y.o. female with a history of HSV and GERD who presents emergency department complaining of a painful right breast mass she noticed this past weekend.  She also noticed a small amount of nipple discharge that she said looked like colostrum.  No other symptoms noted.  HPI     Home Medications Prior to Admission medications   Medication Sig Start Date End Date Taking? Authorizing Provider  potassium chloride SA (KLOR-CON M) 20 MEQ tablet Take 1 tablet (20 mEq total) by mouth 2 (two) times daily for 3 days. 05/09/22 05/12/22 Yes Jarret Torre T, PA-C  acyclovir (ZOVIRAX) 800 MG tablet Take 800 mg by mouth daily.    [provider]  BIOTIN PO Take 1 tablet by mouth daily.    [provider]  diazepam (VALIUM) 10 MG tablet Take 1 tablet (10 mg total) by mouth every 8 (eight) hours as needed (Muscle spasm). 05/07/21   Daleen Bo, MD  HYDROcodone-acetaminophen (NORCO) 5-325 MG tablet Take 1 tablet by mouth every 4 (four) hours as needed for moderate pain or severe pain. 05/07/21   Daleen Bo, MD  Multiple Vitamin (MULTIVITAMIN WITH MINERALS) TABS tablet Take 1 tablet by mouth daily.    [provider]  ondansetron (ZOFRAN) 4 MG tablet Take 1 tablet (4 mg total) by mouth every 6 (six) hours as needed for nausea or vomiting. 3/79/02   Delora Fuel, MD  OVER THE COUNTER MEDICATION Take 1 tablet by mouth daily. Sea moss    [provider]  oxyCODONE (OXY IR/ROXICODONE) 5 MG immediate release tablet Take 1 tablet (5 mg total) by mouth every 6 (six) hours as needed for breakthrough pain. 04/08/21   Maczis, Barth Kirks, PA-C      Allergies    Adhesive [tape] and Gadolinium derivatives    Review of Systems   Review of Systems  Constitutional:   Negative for chills and fever.  Skin:        Breast pain  All other systems reviewed and are negative.   Physical Exam Updated Vital Signs BP (!) 145/81 (BP Location: Left Arm)   Pulse 89   Temp 99.2 F (37.3 C) (Oral)   Resp 17   Ht '5\' 5"'$  (1.651 m)   Wt 95.3 kg   LMP 05/25/2015   SpO2 100%   BMI 34.95 kg/m  Physical Exam Vitals and nursing note reviewed. Exam conducted with a chaperone present.  Constitutional:      Appearance: Normal appearance.  HENT:     Head: Normocephalic and atraumatic.  Eyes:     Conjunctiva/sclera: Conjunctivae normal.  Pulmonary:     Effort: Pulmonary effort is normal. No respiratory distress.  Chest:  Breasts:    Right: Mass and tenderness present. No nipple discharge or skin change.     Left: Normal.     Comments: Discrete palpable mass in the upper inner quadrant of the right breast. Mobile with poorly defined edges. No overlying skin changes.  Skin:    General: Skin is warm and dry.  Neurological:     Mental Status: She is alert.  Psychiatric:        Mood and Affect: Mood normal.        Behavior: Behavior normal.  ED Results / Procedures / Treatments   Labs (all labs ordered are listed, but only abnormal results are displayed) Labs Reviewed  COMPREHENSIVE METABOLIC PANEL - Abnormal; Notable for the following components:      Result Value   Potassium 3.1 (*)    Glucose, Bld 160 (*)    All other components within normal limits  CBC WITH DIFFERENTIAL/PLATELET    EKG None  Radiology No results found.  Procedures Procedures    Medications Ordered in ED Medications - No data to display  ED Course/ Medical Decision Making/ A&P                           Medical Decision Making Risk Prescription drug management.  Patient is a 46 year old female with a history of H SV to and GERD who presents the emergency department complaining of a right breast mass she noticed several days ago.  On exam patient slightly  hypertensive, but otherwise normal vital signs.  She has a discrete mobile mass in the upper inner quadrant of the right breast with poorly defined edges.  No overlying skin changes, or nipple discharge noted although patient had complained about this previously.  I discussed with the patient that I think that she would benefit from further imaging to include a mammogram.  She endorses a family history of breast cancer, and last mammogram was 2 years ago.  Low concern for menstrual changes as patient no longer has her menstrual cycle after hysterectomy.  We will provide referral to breast clinic.  She had baseline labs obtained per triage, with notably a potassium of 3.1, will prescribe some replacement.  Patient discharged home in good condition, and all questions answered.  Final Clinical Impression(s) / ED Diagnoses Final diagnoses:  Mass of upper inner quadrant of right breast  Breast pain    Rx / DC Orders ED Discharge Orders          Ordered    Ambulatory referral to Breast Clinic       Comments: Painful breast mass   05/09/22 2217    potassium chloride SA (KLOR-CON M) 20 MEQ tablet  2 times daily        05/09/22 2217           Portions of this report may have been transcribed using voice recognition software. Every effort was made to ensure accuracy; however, inadvertent computerized transcription errors may be present.    Estill Cotta 05/09/22 2225    Blanchie Dessert, MD 05/09/22 [id number]

## 2022-05-17 ENCOUNTER — Other Ambulatory Visit: Payer: Self-pay | Admitting: Obstetrics & Gynecology

## 2022-05-17 DIAGNOSIS — N6310 Unspecified lump in the right breast, unspecified quadrant: Secondary | ICD-10-CM

## 2022-05-23 ENCOUNTER — Other Ambulatory Visit: Payer: Self-pay | Admitting: *Deleted

## 2022-05-23 DIAGNOSIS — N6320 Unspecified lump in the left breast, unspecified quadrant: Secondary | ICD-10-CM

## 2022-05-23 DIAGNOSIS — N6310 Unspecified lump in the right breast, unspecified quadrant: Secondary | ICD-10-CM

## 2022-05-23 DIAGNOSIS — N63 Unspecified lump in unspecified breast: Secondary | ICD-10-CM

## 2022-05-26 ENCOUNTER — Other Ambulatory Visit: Payer: Self-pay

## 2022-06-20 ENCOUNTER — Other Ambulatory Visit: Payer: Self-pay

## 2022-06-20 ENCOUNTER — Ambulatory Visit: Payer: Self-pay

## 2022-08-24 ENCOUNTER — Ambulatory Visit: Payer: Self-pay | Admitting: Hematology and Oncology

## 2022-08-24 VITALS — BP 144/100 | Wt 233.0 lb

## 2022-08-24 DIAGNOSIS — N6320 Unspecified lump in the left breast, unspecified quadrant: Secondary | ICD-10-CM

## 2022-08-24 DIAGNOSIS — N6310 Unspecified lump in the right breast, unspecified quadrant: Secondary | ICD-10-CM

## 2022-08-24 DIAGNOSIS — Z1211 Encounter for screening for malignant neoplasm of colon: Secondary | ICD-10-CM

## 2022-08-24 NOTE — Progress Notes (Signed)
[name] is a 46 y.o. female who presents to [facility] clinic today with complaint of right breast swelling.    Pap Smear: Pap not smear completed today. Patient had hysterectomy in 2016 for heavy menstrual periods.   Physical exam: Breasts Breasts symmetrical. No skin abnormalities bilateral breasts. No nipple retraction bilateral breasts. No nipple discharge bilateral breasts. No lymphadenopathy. No lumps palpated bilateral breasts.     MS DIGITAL DIAG TOMO BILAT  Result Date: 01/11/2021 CLINICAL DATA:  46 year old female presenting for annual bilateral mammogram, 1 year follow-up of a probably benign right breast mass, and provider palpated right axillary lump. EXAM: DIGITAL DIAGNOSTIC BILATERAL MAMMOGRAM WITH TOMOSYNTHESIS AND CAD; ULTRASOUND RIGHT BREAST LIMITED TECHNIQUE: Bilateral digital diagnostic mammography and breast tomosynthesis was performed. The images were evaluated with computer-aided detection.; Targeted ultrasound examination of the right breast was performed COMPARISON:  Previous exam(s). ACR Breast Density Category c: The breast tissue is heterogeneously dense, which may obscure small masses. FINDINGS: Circumscribed mass in the upper outer right breast is less conspicuous on today's views. Radiopaque BB in the right axilla demonstrates no associated suspicious findings. No other suspicious findings in the remainder of either breast. The parenchymal pattern is stable. Targeted ultrasound is performed, showing stable appearance of an oval, circumscribed hypoechoic mass at the 12 o'clock position 3 cm from the nipple. It measures 1.7 x 1.7 x 0.8 cm (previously 1.7 x 1.7 x 0.9 cm). Note is made of surrounding waxing and waning cysts. Evaluation of the right axilla demonstrates no suspicious findings corresponding with the provider palpated lump. IMPRESSION: 1. Stable, probably benign right breast mass. Recommendation is for a final ultrasound follow-up in 1 year to demonstrate 2  year stability. 2. No suspicious mammographic or sonographic findings corresponding with the right axillary lump. Recommendation is for clinical and symptomatic follow-up. 3. No mammographic evidence of malignancy in the remainder of either breast. RECOMMENDATION: Bilateral diagnostic mammogram and right breast ultrasound in 1 year. I have discussed the findings and recommendations with the patient. If applicable, a reminder letter will be sent to the patient regarding the next appointment. BI-RADS CATEGORY  3: Probably benign. Electronically Signed   By: Kristopher Oppenheim M.D.   On: 01/11/2021 15:07   MS DIGITAL DIAG TOMO BILAT  Result Date: 01/01/2020 CLINICAL DATA:  Two possible masses in each breast and calcifications in the right breast on a recent screening mammogram. Her mother was diagnosed breast cancer at age 44. EXAM: DIGITAL DIAGNOSTIC BILATERAL MAMMOGRAM WITH CAD AND TOMO ULTRASOUND BILATERAL BREAST COMPARISON:  Previous exam(s). ACR Breast Density Category c: The breast tissue is heterogeneously dense, which may obscure small masses. FINDINGS: 3D tomographic and 2D generated spot compression views of both breasts were obtained as well as 2D true lateral and spot magnification views of the right breast. These demonstrate multiple markedly to moderately dilated retroareolar ducts bilaterally, corresponding to the recently suspected masses. There is also a small, rounded, circumscribed mass in the posterior central right breast, slightly laterally. There is a small, rounded, circumscribed mass in the central left breast. Also demonstrated is a larger, oval, circumscribed mass in the posterior left breast, slightly laterally, in the craniocaudal projection. There are a large number of punctate calcifications throughout the outer right breast. Multiple of these demonstrate dependent layering in the true lateral projection. Mammographic images were processed with CAD. Targeted ultrasound is performed,  showing multiple significantly dilated retroareolar ducts in both breasts with no masses in either retroareolar region. In the 12 o'clock  position of the right breast, 3 cm from the nipple, a 1.7 x 1.7 x 0.8 cm oval, horizontally oriented, circumscribed, hypoechoic mass is demonstrated. This contains some coarse calcifications. Also demonstrated is a 1.0 cm simple cyst in the 12 o'clock position of the right breast, 4 cm from the nipple. There is also a smaller cyst adjacent to the superior aspect of the solid mass. In the 1 o'clock position of the left breast, 5 cm from the nipple, 2 adjacent cysts or a bilobed cyst with a thin internal septation are demonstrated, measuring 1.9 x 1.1 x 0.8 cm. In the 11:30 o'clock position of the left breast, posteriorly, there is a bilobed simple cyst measuring 2.0 cm in maximum diameter. No solid masses were seen on the left. IMPRESSION: 1. 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 2. Bilateral benign breast cysts. 3. Bilateral benign breast duct ectasia. 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 This has been discussed with the patient and scheduled. I have discussed the findings and recommendations with the patient. If applicable, a reminder letter will be sent to the patient regarding the next appointment. BI-RADS CATEGORY  3: Probably benign. Electronically Signed   By: Claudie Revering M.D.   On: 01/01/2020 14:38      Pelvic/Bimanual Pap is not indicated today    Smoking History: Patient has is a former smoker and was not referred to quit line.    Patient Navigation: Patient education provided. Access to services provided for patient through [facility] program. No interpreter provided. No transportation provided   Colorectal Cancer Screening: Per patient has never had colonoscopy completed No complaints today. FIT test being mailed.     Breast and Cervical Cancer Risk Assessment: Patient has family history of breast cancer with her mother and maternal cousins. Patient does not have history of cervical dysplasia, immunocompromised, or DES exposure in-utero.  Risk Scores as of 08/24/2022     [facility] Janus           5-year 0.88 %   Lifetime 9.28 %            Last calculated by Claretha Cooper, CMA on 08/24/2022 at 10:29 AM        A: [facility] exam without pap smear Complaint of right breast swelling; resolved today. Benign exam. Follow up of possible mass and calcifications in both breasts.   P: Referred patient to the [facility] for a diagnostic mammogram. Appointment scheduled 08/25/22.  Dayton Scrape A, NP 08/24/2022 10:50 AM

## 2022-08-24 NOTE — Patient Instructions (Signed)
Taught Para March about breast self awareness and gave educational materials to take home. Patient did not need a Pap smear today due to hysterectomy. Referred patient to the [facility] for diagnostic mammogram. Appointment scheduled for 08/25/22. Patient aware of appointment and will be there. Let patient know will follow up with her within the next couple weeks with results. Para March verbalized understanding.  Melodye Ped, NP 11:04 AM

## 2022-08-25 ENCOUNTER — Ambulatory Visit
Admission: RE | Admit: 2022-08-25 | Discharge: 2022-08-25 | Disposition: A | Discharge status: Home / Self Care | Payer: No Typology Code available for payment source | Source: Ambulatory Visit | Attending: Obstetrics and Gynecology | Admitting: Obstetrics and Gynecology

## 2022-08-25 DIAGNOSIS — N6320 Unspecified lump in the left breast, unspecified quadrant: Secondary | ICD-10-CM

## 2022-08-25 DIAGNOSIS — N6310 Unspecified lump in the right breast, unspecified quadrant: Secondary | ICD-10-CM

## 2022-08-30 ENCOUNTER — Other Ambulatory Visit: Payer: Self-pay

## 2022-08-30 ENCOUNTER — Emergency Department (HOSPITAL_BASED_OUTPATIENT_CLINIC_OR_DEPARTMENT_OTHER): Payer: 59

## 2022-08-30 ENCOUNTER — Emergency Department (HOSPITAL_BASED_OUTPATIENT_CLINIC_OR_DEPARTMENT_OTHER)
Admission: EM | Admit: 2022-08-30 | Discharge: 2022-08-30 | Disposition: A | Discharge status: Home / Self Care | Payer: 59 | Attending: Emergency Medicine | Admitting: Emergency Medicine

## 2022-08-30 ENCOUNTER — Encounter (HOSPITAL_BASED_OUTPATIENT_CLINIC_OR_DEPARTMENT_OTHER): Payer: Self-pay

## 2022-08-30 DIAGNOSIS — R030 Elevated blood-pressure reading, without diagnosis of hypertension: Secondary | ICD-10-CM | POA: Diagnosis present

## 2022-08-30 DIAGNOSIS — R519 Headache, unspecified: Secondary | ICD-10-CM | POA: Insufficient documentation

## 2022-08-30 HISTORY — DX: Benign neoplasm of brain, unspecified: D33.2

## 2022-08-30 LAB — CBC WITH DIFFERENTIAL/PLATELET
Abs Immature Granulocytes: 0.02 10*3/uL (ref 0.00–0.07)
Basophils Absolute: 0 10*3/uL (ref 0.0–0.1)
Basophils Relative: 0 %
Eosinophils Absolute: 0.2 10*3/uL (ref 0.0–0.5)
Eosinophils Relative: 2 %
HCT: 41.5 % (ref 36.0–46.0)
Hemoglobin: 13.6 g/dL (ref 12.0–15.0)
Immature Granulocytes: 0 %
Lymphocytes Relative: 34 %
Lymphs Abs: 2.6 10*3/uL (ref 0.7–4.0)
MCH: 27.1 pg (ref 26.0–34.0)
MCHC: 32.8 g/dL (ref 30.0–36.0)
MCV: 82.8 fL (ref 80.0–100.0)
Monocytes Absolute: 0.3 10*3/uL (ref 0.1–1.0)
Monocytes Relative: 4 %
Neutro Abs: 4.5 10*3/uL (ref 1.7–7.7)
Neutrophils Relative %: 60 %
Platelets: 280 10*3/uL (ref 150–400)
RBC: 5.01 MIL/uL (ref 3.87–5.11)
RDW: 12.8 % (ref 11.5–15.5)
WBC: 7.5 10*3/uL (ref 4.0–10.5)
nRBC: 0 % (ref 0.0–0.2)

## 2022-08-30 LAB — BASIC METABOLIC PANEL
Anion gap: 9 (ref 5–15)
BUN: 9 mg/dL (ref 6–20)
CO2: 26 mmol/L (ref 22–32)
Calcium: 9.3 mg/dL (ref 8.9–10.3)
Chloride: 105 mmol/L (ref 98–111)
Creatinine, Ser: 0.68 mg/dL (ref 0.44–1.00)
GFR, Estimated: >60 mL/min (ref >60)
Glucose, Bld: 170 mg/dL — ABNORMAL HIGH (ref 70–99)
Potassium: 3.3 mmol/L — ABNORMAL LOW (ref 3.5–5.1)
Sodium: 140 mmol/L (ref 135–145)

## 2022-08-30 LAB — URINALYSIS, ROUTINE W REFLEX MICROSCOPIC
Bilirubin Urine: NEGATIVE
Glucose, UA: NEGATIVE mg/dL
Ketones, ur: NEGATIVE mg/dL
Leukocytes,Ua: NEGATIVE
Nitrite: NEGATIVE
Specific Gravity, Urine: 1.026 (ref 1.005–1.030)
pH: 6 (ref 5.0–8.0)

## 2022-08-30 LAB — CBG MONITORING, ED: Glucose-Capillary: 165 mg/dL — ABNORMAL HIGH (ref 70–99)

## 2022-08-30 MED ORDER — KETOROLAC TROMETHAMINE 15 MG/ML IJ SOLN
15.0000 mg | Freq: Once | INTRAMUSCULAR | Status: AC
  Administered 2022-08-30: 15 mg via INTRAVENOUS
  Filled 2022-08-30: qty 1

## 2022-08-30 MED ORDER — SODIUM CHLORIDE 0.9 % IV BOLUS
1000.0000 mL | Freq: Once | INTRAVENOUS | Status: AC
  Administered 2022-08-30: 1000 mL via INTRAVENOUS

## 2022-08-30 MED ORDER — ONDANSETRON HCL 4 MG/2ML IJ SOLN
4.0000 mg | Freq: Once | INTRAMUSCULAR | Status: AC
  Administered 2022-08-30: 4 mg via INTRAVENOUS
  Filled 2022-08-30: qty 2

## 2022-08-30 NOTE — Discharge Instructions (Signed)
It was a pleasure taking care of you today!  Your CT scan did not show any concerning findings today.  Your labs did not show any concerning findings today.  At home you may take 500 mg Tylenol every 6 hours and alternate with 600 mg ibuprofen every 6 hours as needed for pain for no more than 7 days.  Ensure to maintain fluid intake.  You may follow-up with [facility] or [facility] as needed for your primary care needs.  Return to the emergency department if you are experiencing increasing/worsening symptoms.

## 2022-08-30 NOTE — ED Provider Notes (Signed)
[facility] EMERGENCY DEPT Provider Note   CSN: [id number] Arrival date & time: 08/30/22  3016     History  Chief Complaint  Patient presents with   Hypertension    Kristina Castro is a 46 y.o. female who presents to the ED with concerns for elevated blood pressure onset prior to arrival.  Patient notes that she was at work training when she became dizzy.  Her blood pressure was checked and was noted to be 160/110.  Patient was told to come here for further evaluation of her symptoms.  Patient also notes that she has had a gradual onset headache for the past 3 days.  Patient also notes blurred vision, patient not sure if she supposed to be wearing glasses but notes that her vision for the way is blurry to her.  Denies history of hypertension.  Does not have a primary care provider at this time.  Denies chest pain, abdominal pain, shortness of breath, fatigue, lightheadedness, urinary symptoms, diplopia.  Denies history of diabetes.  Denies history of vertigo.  The history is provided by the patient. No language interpreter was used.       Home Medications Prior to Admission medications   Medication Sig Start Date End Date Taking? Authorizing Provider  acyclovir (ZOVIRAX) 800 MG tablet Take 800 mg by mouth daily.    [provider]  diazepam (VALIUM) 10 MG tablet Take 1 tablet (10 mg total) by mouth every 8 (eight) hours as needed (Muscle spasm). Patient not taking: Reported on 08/24/2022 05/07/21   Daleen Bo, MD  HYDROcodone-acetaminophen [facility]) 5-325 MG tablet Take 1 tablet by mouth every 4 (four) hours as needed for moderate pain or severe pain. Patient not taking: Reported on 08/24/2022 05/07/21   Daleen Bo, MD  ondansetron (ZOFRAN) 4 MG tablet Take 1 tablet (4 mg total) by mouth every 6 (six) hours as needed for nausea or vomiting. Patient not taking: Reported on 01/0/9323 5/57/32   Delora Fuel, MD  OVER THE COUNTER MEDICATION Take 1 tablet by  mouth daily. Sea moss    [provider]  oxybutynin (DITROPAN-XL) 10 MG 24 hr tablet Take 10 mg by mouth at bedtime.    [provider]  oxyCODONE (OXY IR/ROXICODONE) 5 MG immediate release tablet Take 1 tablet (5 mg total) by mouth every 6 (six) hours as needed for breakthrough pain. Patient not taking: Reported on 08/24/2022 04/08/21   Jillyn Ledger, PA-C      Allergies    Adhesive [tape] and Gadolinium derivatives    Review of Systems   Review of Systems  All other systems reviewed and are negative.   Physical Exam Updated Vital Signs BP 123/74   Pulse 94   Temp 98.1 F (36.7 C) (Oral)   Resp 17   Ht '5\' 5"'$  (1.651 m)   Wt 105.7 kg   LMP 05/25/2015   SpO2 100%   BMI 38.77 kg/m  Physical Exam Vitals and nursing note reviewed.  Constitutional:      General: She is not in acute distress.    Appearance: She is not diaphoretic.  HENT:     Head: Normocephalic and atraumatic.     Mouth/Throat:     Pharynx: No oropharyngeal exudate.  Eyes:     General: No scleral icterus.    Conjunctiva/sclera: Conjunctivae normal.  Cardiovascular:     Rate and Rhythm: Normal rate and regular rhythm.     Pulses: Normal pulses.     Heart sounds: Normal  heart sounds.  Pulmonary:     Effort: Pulmonary effort is normal. No respiratory distress.     Breath sounds: Normal breath sounds. No wheezing.  Abdominal:     General: Bowel sounds are normal.     Palpations: Abdomen is soft. There is no mass.     Tenderness: There is no abdominal tenderness. There is no guarding or rebound.  Musculoskeletal:        General: Normal range of motion.     Cervical back: Normal range of motion and neck supple.  Skin:    General: Skin is warm and dry.  Neurological:     Mental Status: She is alert.     Comments: No focal neurological deficits. Negative pronator drift. Able to ambulate without assistance or difficulty. Strength and sensation intact to BUE and BLE. Grip strength 5/5  bilaterally.  Normal finger-nose testing.  Normal heel-to-shin testing.  Cranial nerves II through XII intact.  Psychiatric:        Behavior: Behavior normal.     ED Results / Procedures / Treatments   Labs (all labs ordered are listed, but only abnormal results are displayed) Labs Reviewed  URINALYSIS, ROUTINE W REFLEX MICROSCOPIC - Abnormal; Notable for the following components:      Result Value   Hgb urine dipstick SMALL (*)    Protein, ur TRACE (*)    Bacteria, UA RARE (*)    All other components within normal limits  BASIC METABOLIC PANEL - Abnormal; Notable for the following components:   Potassium 3.3 (*)    Glucose, Bld 170 (*)    All other components within normal limits  CBG MONITORING, ED - Abnormal; Notable for the following components:   Glucose-Capillary 165 (*)    All other components within normal limits  CBC WITH DIFFERENTIAL/PLATELET    EKG EKG Interpretation  Date/Time:  Wednesday August 30 2022 09:39:25 EST Ventricular Rate:  97 PR Interval:  144 QRS Duration: 91 QT Interval:  334 QTC Calculation: 425 R Axis:   49 Text Interpretation: Sinus rhythm no acute ST/T changes Confirmed by Sherwood Gambler [id number]) on 08/30/2022 9:48:28 AM  Radiology CT Head Wo Contrast  Result Date: 08/30/2022 CLINICAL DATA:  Headache, increasing frequency or severity. History of right glomus jugulare tumor EXAM: CT HEAD WITHOUT CONTRAST TECHNIQUE: Contiguous axial images were obtained from the base of the skull through the vertex without intravenous contrast. RADIATION DOSE REDUCTION: This exam was performed according to the departmental dose-optimization program which includes automated exposure control, adjustment of the mA and/or kV according to patient size and/or use of iterative reconstruction technique. COMPARISON:  05/07/2021 FINDINGS: Brain: No evidence of acute infarction, hemorrhage, hydrocephalus, extra-axial collection or mass lesion/mass effect. Vascular: No  hyperdense vessel or unexpected calcification. 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 Negative for calvarial fracture. Sinuses/Orbits: No acute finding. Other: None. IMPRESSION: 1. No acute intracranial abnormality. 2. 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 Electronically Signed   By: Davina Poke D.O.   On: 08/30/2022 12:24    Procedures Procedures    Medications Ordered in ED Medications  sodium chloride 0.9 % bolus 1,000 mL (0 mLs Intravenous Stopped 08/30/22 1158)  ondansetron (ZOFRAN) injection 4 mg (4 mg Intravenous Given 08/30/22 1029)  ketorolac (TORADOL) 15 MG/ML injection 15 mg (15 mg Intravenous Given 08/30/22 1159)    ED Course/ Medical Decision Making/ A&P Clinical  Course as of 08/30/22 1322  Wed Aug 30, 2022  1206 Re-evalauted and noted improvement of her migraine with treatment regimen in the ED.  Discussed with patient lab and imaging findings. [SB]  3354 Discussed with patient UA, pt notes that she always has blood in her urine and denies urinary symptoms. Discussed with pt discharge treatment plan. Answered all available questions. Pt appears safe for discharge at this time.  [SB]    Clinical Course User Index [SB] Kimari Coudriet A, PA-C                           Medical Decision Making Amount and/or Complexity of Data Reviewed Labs: ordered. Radiology: ordered.  Risk Prescription drug management.   Pt presents with elevated blood pressure, headache, dizziness onset today.  Patient afebrile.  Initial blood pressure of 151/73.  On exam patient with no acute focal neurological deficits.  No acute cardiovascular respiratory exam findings. Differential diagnosis includes migraine headache, SAH, ICH, hypoglycemia, anemia, arrhythmia.    Labs:  I ordered, and personally interpreted labs.  The pertinent results include:    CBG 165 Urinalysis with a small amount of hemoglobin otherwise nitrate and leukocyte negative BMP with slightly decreased potassium at 3.3, improved from previous value of 3.1 CBC without leukocytosis  Imaging: I ordered imaging studies including CT head without I independently visualized and interpreted imaging which showed: 1. No acute intracranial abnormality.  2. Unchanged widening of the right jugular foramen compatible with  known history of right glomus jugulare tumor.   I agree with the radiologist interpretation  Medications:  I ordered medication including Toradol, IV fluids, Zofran for symptom management Reevaluation of the patient after these medicines and interventions, I reevaluated the patient and found that they have improved I have reviewed the patients home medicines and have made adjustments as needed   Disposition: Presentation suspicious for headache.  Also notable for elevated blood pressure, no concerns at this time for hypertension, blood pressure consistently downtrending throughout ED visit.  Doubt concerns for [facility], ICH.  Doubt concerns for hypoglycemia, anemia, arrhythmia at this time.  After consideration of the diagnostic results and the patients response to treatment, I feel that the patient would benefit from Discharge home. Supportive care measures and strict return precautions discussed with patient at bedside. Pt acknowledges and verbalizes understanding. Pt appears safe for discharge. Follow up as indicated in discharge paperwork.    This chart was dictated using voice recognition software, Dragon. Despite the best efforts of this provider to proofread and correct errors, errors may still occur which can change documentation meaning.   Final Clinical Impression(s) / ED Diagnoses Final diagnoses:  Elevated blood pressure reading  Acute nonintractable headache, unspecified headache type    Rx / DC Orders ED Discharge Orders     None          Walda Hertzog A, PA-C 08/30/22 1322    Sherwood Gambler, MD 09/01/22 [id number]

## 2022-08-30 NOTE — ED Triage Notes (Signed)
At work training and got dizzy. They checked her BP @ 160/110. Headache x3 days. Denies chest pain, abd pain, SOB, fatigue, or nosebleeds. Slight blurred vision but also thinks she needs glasses.

## 2022-09-05 NOTE — Progress Notes (Signed)
FIT test has been mailed to the pt 09/05/2022.

## 2022-11-15 ENCOUNTER — Encounter: Payer: Commercial Managed Care - PPO | Admitting: Nurse Practitioner

## 2022-11-15 NOTE — Progress Notes (Signed)
Mother made appointment for her son in her chart

## 2022-11-15 NOTE — Progress Notes (Signed)
Subjective:    Kristina Castro - 47 y.o. female MRN [id number]  Date of birth: 03-Dec-1975  HPI  Kristina Castro is to establish care and annual physical exam.  Current issues and/or concerns: - Intermittent bilateral hand and bilateral lower extremities edema for months. Endorses some discomfort. States sometimes notices worse after working. She does use her hands frequently. She wears compression stockings as needed. She does not intake salt. She denies red flag symptoms such as but not limited to shortness of breath, chest pain, lower extremity redness/tenderness/warmth. She is unable to take Furosemide to assist with edema due to she is currently taking Oxybutynin for urinary leakage managed by her gynecologist.  - Reports she is established with Gynecology for women's health maintenance (pap test and mammogram).  - Reports history of brain tumor. Reports she is established with Neurosurgery and Radiology Oncology for the same.  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 States she feels "something is going on" with the left ear in regards to hearing. She denies additional symptoms. - No further issues/concerns for discussion on today.    ROS per HPI   Health Maintenance:  Health Maintenance Due  Topic Date Due   COVID-19 Vaccine (1) Never done   Hepatitis C Screening  Never done   DTaP/Tdap/Td (1 - Tdap) Never done   PAP SMEAR-Modifier  02/23/2018   INFLUENZA VACCINE  Never done   COLONOSCOPY (Pts 45-20yr Insurance coverage will need to be confirmed)  Never done    Past Medical History: Patient Active Problem List   Diagnosis Date Noted   Diverticulosis 11/20/2022   Gastritis 11/20/2022   Acute calculous cholecystitis 04/06/2021   Status post gamma knife treatment 08/21/2018   Cervicalgia 06/17/2018   Acute pain of right shoulder 06/17/2018   [place] [place]   [place] [place]   Postoperative intra-abdominal abscess     Pelvic abscess in female    Pelvic mass in female 06/13/2015   S/P hysterectomy 05/25/2015   Abdominal pain 05/11/2013     Social History   reports that she has quit smoking. Her smoking use included cigars. She has been exposed to tobacco smoke. She has never used smokeless tobacco. She reports current alcohol use of about 7.0 standard drinks of alcohol per week. She reports that she does not use drugs.   Family History  family history includes Breast cancer (age of onset: 45 in her mother; Cancer in her mother; Cirrhosis in her brother; Diabetes in her maternal grandmother and mother; Hypertension in her father and paternal aunt; Kidney disease in her father; Stroke in her maternal grandmother.   Medications: reviewed and updated   Objective:   Physical Exam BP 135/89 (BP Location: Left Arm, Patient Position: Sitting, Cuff Size: Large)   Pulse 83   Temp 98.3 F (36.8 C)   Resp 16   Ht '5\' 4"'$  (1.626 m)   Wt 230 lb (104.3 kg)   LMP 05/25/2015   SpO2 96%   BMI 39.48 kg/m   Physical Exam HENT:     Head: Normocephalic and atraumatic.     Right Ear: Tympanic membrane, ear canal and external ear normal.     Left Ear: Tympanic membrane, ear canal and external ear normal.     Nose: Nose normal.     Mouth/Throat:     Mouth: Mucous membranes are moist.     Pharynx: Oropharynx is clear.  Eyes:  Extraocular Movements: Extraocular movements intact.     Conjunctiva/sclera: Conjunctivae normal.     Pupils: Pupils are equal, round, and reactive to light.  Cardiovascular:     Rate and Rhythm: Normal rate and regular rhythm.     Pulses: Normal pulses.     Heart sounds: Normal heart sounds.  Pulmonary:     Effort: Pulmonary effort is normal.     Breath sounds: Normal breath sounds.  Chest:     Comments: Patient declined.  Abdominal:     General: Bowel sounds are normal.     Palpations: Abdomen is soft.  Genitourinary:    Comments: Patient declined.  Musculoskeletal:         General: Normal range of motion.     Right shoulder: Normal.     Left shoulder: Normal.     Right upper arm: Normal.     Left upper arm: Normal.     Right elbow: Normal.     Left elbow: Normal.     Right forearm: Normal.     Left forearm: Normal.     Right wrist: Normal.     Left wrist: Normal.     Right hand: Normal.     Left hand: Normal.     Cervical back: Normal, normal range of motion and neck supple.     Thoracic back: Normal.     Lumbar back: Normal.     Right hip: Normal.     Left hip: Normal.     Right upper leg: Normal.     Left upper leg: Normal.     Right knee: Normal.     Left knee: Normal.     Right lower leg: Normal.     Left lower leg: Normal.     Right ankle: Normal.     Left ankle: Normal.     Right foot: Normal.     Left foot: Normal.     Comments: Bilateral ankles +1 edema without additional presentation.  Skin:    General: Skin is warm and dry.     Capillary Refill: Capillary refill takes less than 2 seconds.  Neurological:     General: No focal deficit present.     Mental Status: She is alert and oriented to person, place, and time.  Psychiatric:        Mood and Affect: Mood normal.        Behavior: Behavior normal.      Assessment & Plan:  1. Encounter to establish care 2. Annual physical exam - Counseled on 150 minutes of exercise per week as tolerated, healthy eating (including decreased daily intake of saturated fats, cholesterol, added sugars, sodium), STI prevention, and routine healthcare maintenance.  3. Screening for metabolic disorder - Routine screening.  - Hepatic Function Panel  4. Diabetes mellitus screening - Routine screening.  - Hemoglobin A1c  5. Screening cholesterol level - Routine screening.  - Lipid panel  6. Thyroid disorder screen - Routine screening.  - TSH  7. Encounter for screening mammogram for malignant neoplasm of breast 8. Pap smear for cervical cancer screening 9. Routine screening for STI  (sexually transmitted infection) - Patient reports she is established with Gynecology.   10. Colon cancer screening - Referral to Gastroenterology for colon cancer screening by colonoscopy. - Ambulatory referral to Gastroenterology  11. Need for hepatitis C screening test - Routine screening.  - Hepatitis C Antibody  12. Localized edema - Referral to Cardiology for further evaluation/management.  - Ambulatory referral to  Cardiology  13. Encounter for hearing examination, unspecified whether abnormal findings 14. Decreased hearing of right ear - Referral to ENT for further evaluation/management.  - Ambulatory referral to ENT    Patient was given clear instructions to go to Emergency Department or return to medical center if symptoms don't improve, worsen, or new problems develop.The patient verbalized understanding.  I discussed the assessment and treatment plan with the patient. The patient was provided an opportunity to ask questions and all were answered. The patient agreed with the plan and demonstrated an understanding of the instructions.   The patient was advised to call back or seek an in-person evaluation if the symptoms worsen or if the condition fails to improve as anticipated.    Durene Fruits, NP 11/20/2022, 10:01 AM Primary Care at [facility]

## 2022-11-20 ENCOUNTER — Encounter: Payer: Self-pay | Admitting: Family

## 2022-11-20 ENCOUNTER — Ambulatory Visit (INDEPENDENT_AMBULATORY_CARE_PROVIDER_SITE_OTHER): Payer: Commercial Managed Care - PPO | Admitting: Family

## 2022-11-20 VITALS — BP 135/89 | HR 83 | Temp 98.3°F | Resp 16 | Ht 64.0 in | Wt 230.0 lb

## 2022-11-20 DIAGNOSIS — Z113 Encounter for screening for infections with a predominantly sexual mode of transmission: Secondary | ICD-10-CM | POA: Diagnosis not present

## 2022-11-20 DIAGNOSIS — Z13228 Encounter for screening for other metabolic disorders: Secondary | ICD-10-CM | POA: Diagnosis not present

## 2022-11-20 DIAGNOSIS — Z1211 Encounter for screening for malignant neoplasm of colon: Secondary | ICD-10-CM

## 2022-11-20 DIAGNOSIS — Z13220 Encounter for screening for lipoid disorders: Secondary | ICD-10-CM | POA: Diagnosis not present

## 2022-11-20 DIAGNOSIS — Z131 Encounter for screening for diabetes mellitus: Secondary | ICD-10-CM

## 2022-11-20 DIAGNOSIS — R600 Localized edema: Secondary | ICD-10-CM | POA: Diagnosis not present

## 2022-11-20 DIAGNOSIS — Z124 Encounter for screening for malignant neoplasm of cervix: Secondary | ICD-10-CM

## 2022-11-20 DIAGNOSIS — K2970 Gastritis, unspecified, without bleeding: Secondary | ICD-10-CM | POA: Insufficient documentation

## 2022-11-20 DIAGNOSIS — Z0001 Encounter for general adult medical examination with abnormal findings: Secondary | ICD-10-CM | POA: Diagnosis not present

## 2022-11-20 DIAGNOSIS — Z0000 Encounter for general adult medical examination without abnormal findings: Secondary | ICD-10-CM

## 2022-11-20 DIAGNOSIS — Z7689 Persons encountering health services in other specified circumstances: Secondary | ICD-10-CM | POA: Diagnosis not present

## 2022-11-20 DIAGNOSIS — H9191 Unspecified hearing loss, right ear: Secondary | ICD-10-CM | POA: Diagnosis not present

## 2022-11-20 DIAGNOSIS — Z1159 Encounter for screening for other viral diseases: Secondary | ICD-10-CM | POA: Diagnosis not present

## 2022-11-20 DIAGNOSIS — Z1329 Encounter for screening for other suspected endocrine disorder: Secondary | ICD-10-CM

## 2022-11-20 DIAGNOSIS — Z1231 Encounter for screening mammogram for malignant neoplasm of breast: Secondary | ICD-10-CM

## 2022-11-20 DIAGNOSIS — K5790 Diverticulosis of intestine, part unspecified, without perforation or abscess without bleeding: Secondary | ICD-10-CM | POA: Insufficient documentation

## 2022-11-20 DIAGNOSIS — Z0110 Encounter for examination of ears and hearing without abnormal findings: Secondary | ICD-10-CM

## 2022-11-20 NOTE — Patient Instructions (Signed)
Preventive Care 40-46 Years Old, Female Preventive care refers to lifestyle choices and visits with your health care provider that can promote health and wellness. Preventive care visits are also called wellness exams. What can I expect for my preventive care visit? Counseling Your health care provider may ask you questions about your: Medical history, including: Past medical problems. Family medical history. Pregnancy history. Current health, including: Menstrual cycle. Method of birth control. Emotional well-being. Home life and relationship well-being. Sexual activity and sexual health. Lifestyle, including: Alcohol, nicotine or tobacco, and drug use. Access to firearms. Diet, exercise, and sleep habits. Work and work environment. Sunscreen use. Safety issues such as seatbelt and bike helmet use. Physical exam Your health care provider will check your: Height and weight. These may be used to calculate your BMI (body mass index). BMI is a measurement that tells if you are at a healthy weight. Waist circumference. This measures the distance around your waistline. This measurement also tells if you are at a healthy weight and may help predict your risk of certain diseases, such as type 2 diabetes and high blood pressure. Heart rate and blood pressure. Body temperature. Skin for abnormal spots. What immunizations do I need?  Vaccines are usually given at various ages, according to a schedule. Your health care provider will recommend vaccines for you based on your age, medical history, and lifestyle or other factors, such as travel or where you work. What tests do I need? Screening Your health care provider may recommend screening tests for certain conditions. This may include: Lipid and cholesterol levels. Diabetes screening. This is done by checking your blood sugar (glucose) after you have not eaten for a while (fasting). Pelvic exam and Pap test. Hepatitis B test. Hepatitis C  test. HIV (human immunodeficiency virus) test. STI (sexually transmitted infection) testing, if you are at risk. Lung cancer screening. Colorectal cancer screening. Mammogram. Talk with your health care provider about when you should start having regular mammograms. This may depend on whether you have a family history of breast cancer. BRCA-related cancer screening. This may be done if you have a family history of breast, ovarian, tubal, or peritoneal cancers. Bone density scan. This is done to screen for osteoporosis. Talk with your health care provider about your test results, treatment options, and if necessary, the need for more tests. Follow these instructions at home: Eating and drinking  Eat a diet that includes fresh fruits and vegetables, whole grains, lean protein, and low-fat dairy products. Take vitamin and mineral supplements as recommended by your health care provider. Do not drink alcohol if: Your health care provider tells you not to drink. You are pregnant, may be pregnant, or are planning to become pregnant. If you drink alcohol: Limit how much you have to 0-1 drink a day. Know how much alcohol is in your drink. In the [place] one drink equals one 12 oz bottle of beer (355 mL), one 5 oz glass of wine (148 mL), or one 1 oz glass of hard liquor (44 mL). Lifestyle Brush your teeth every morning and night with fluoride toothpaste. Floss one time each day. Exercise for at least 30 minutes 5 or more days each week. Do not use any products that contain nicotine or tobacco. These products include cigarettes, chewing tobacco, and vaping devices, such as e-cigarettes. If you need help quitting, ask your health care provider. Do not use drugs. If you are sexually active, practice safe sex. Use a condom or other form of protection to   prevent STIs. If you do not wish to become pregnant, use a form of birth control. If you plan to become pregnant, see your health care provider for a  prepregnancy visit. Take aspirin only as told by your health care provider. Make sure that you understand how much to take and what form to take. Work with your health care provider to find out whether it is safe and beneficial for you to take aspirin daily. Find healthy ways to manage stress, such as: Meditation, yoga, or listening to music. Journaling. Talking to a trusted person. Spending time with friends and family. Minimize exposure to UV radiation to reduce your risk of skin cancer. Safety Always wear your seat belt while driving or riding in a vehicle. Do not drive: If you have been drinking alcohol. Do not ride with someone who has been drinking. When you are tired or distracted. While texting. If you have been using any mind-altering substances or drugs. Wear a helmet and other protective equipment during sports activities. If you have firearms in your house, make sure you follow all gun safety procedures. Seek help if you have been physically or sexually abused. What's next? Visit your health care provider once a year for an annual wellness visit. Ask your health care provider how often you should have your eyes and teeth checked. Stay up to date on all vaccines. This information is not intended to replace advice given to you by your health care provider. Make sure you discuss any questions you have with your health care provider. Document Revised: 03/02/2021 Document Reviewed: 03/02/2021 Elsevier Patient Education  2023 Elsevier Inc.  

## 2022-11-20 NOTE — Progress Notes (Signed)
.  Pt presents to establish care, and physical -declined pap established w/[facility]  -pt experiencing a lot of bilateral leg/ankle edema and sometimes in hands

## 2022-11-21 LAB — LIPID PANEL
Chol/HDL Ratio: 3.1 ratio (ref 0.0–4.4)
Cholesterol, Total: 132 mg/dL (ref 100–199)
HDL: 42 mg/dL (ref >39)
LDL Chol Calc (NIH): 74 mg/dL (ref 0–99)
Triglycerides: 80 mg/dL (ref 0–149)
VLDL Cholesterol Cal: 16 mg/dL (ref 5–40)

## 2022-11-21 LAB — HEPATIC FUNCTION PANEL
ALT: 14 IU/L (ref 0–32)
AST: 15 IU/L (ref 0–40)
Albumin: 4.2 g/dL (ref 3.9–4.9)
Alkaline Phosphatase: 120 IU/L (ref 44–121)
Bilirubin Total: 0.3 mg/dL (ref 0.0–1.2)
Bilirubin, Direct: 0.12 mg/dL (ref 0.00–0.40)
Total Protein: 7 g/dL (ref 6.0–8.5)

## 2022-11-21 LAB — HEPATITIS C ANTIBODY: Hep C Virus Ab: NONREACTIVE

## 2022-11-21 LAB — TSH: TSH: 0.623 u[IU]/mL (ref 0.450–4.500)

## 2022-11-21 LAB — HEMOGLOBIN A1C
Est. average glucose Bld gHb Est-mCnc: 140 mg/dL
Hgb A1c MFr Bld: 6.5 % — ABNORMAL HIGH (ref 4.8–5.6)

## 2022-11-24 NOTE — Progress Notes (Unsigned)
Cardiology Office Note:    Date:  11/24/2022   ID:  Kristina Castro, DOB Oct 11, 1975, MRN [id number]  PCP:  Camillia Herter, NP   [place] Providers Cardiologist:  None { Click to update primary MD,subspecialty MD or APP then REFRESH:1}    Referring MD: Camillia Herter, NP   No chief complaint on file. ***  History of Present Illness:    Kristina Castro is a 47 y.o. female seen at the request of Durene Fruits for evaluation of edema.   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    Past Surgical History:  Procedure Laterality Date   BILATERAL SALPINGECTOMY Bilateral 05/25/2015   Procedure: BILATERAL SALPINGECTOMY;  Surgeon: Linda Hedges, DO;  Location: [place];  Service: Gynecology;  Laterality: Bilateral;   CHOLECYSTECTOMY N/A 04/07/2021   Procedure: LAPAROSCOPIC CHOLECYSTECTOMY;  Surgeon: Jovita Kussmaul, MD;  Location: [facility];  Service: General;  Laterality: N/A;   DILATION AND EVACUATION  x3   last one 2011   IUD REMOVAL Left 05/25/2015   Procedure: NEXPLANON REMOVAL LEFT UPPER ARM;  Surgeon: Linda Hedges, DO;  Location: [place];  Service: Gynecology;  Laterality: Left;   LAPAROSCOPIC ASSISTED VAGINAL HYSTERECTOMY N/A 05/25/2015   Procedure: LAPAROSCOPIC ASSISTED VAGINAL HYSTERECTOMY;  Surgeon: Linda Hedges, DO;  Location: [place];  Service: Gynecology;  Laterality: N/A;    Current Medications: No outpatient medications have been marked as taking for the 11/27/22 encounter  (Appointment) with [place], Caelan Branden M, MD.     Allergies:   Iodine-131, Adhesive [tape], Gadolinium derivatives, and Wound dressing adhesive   Social History   Socioeconomic History   Marital status: Married    Spouse name: Not on file   Number of children: 4   Years of education: Not on file   Highest education level: Associate degree: occupational, Hotel manager, or vocational program  Occupational History   Not on file  Tobacco Use   Smoking status: Former    Types: Cigars    Passive exposure: Past   Smokeless tobacco: Never   Tobacco comments:    hx smoking on and off--  currently cigar's 3 to 4 cigars per week  Vaping Use   Vaping Use: Never used  Substance and Sexual Activity   Alcohol use: Yes    Alcohol/week: 7.0 standard drinks of alcohol    Types: 7 Glasses of wine per week    Comment: daily wine   Drug use: No   Sexual activity: Yes    Birth control/protection: Surgical    Comment: 05/25/2015 - Hysterectomy  Other Topics Concern   Not on file  Social History Narrative   Not on file   Social Determinants of Health   Financial Resource Strain: Not on file  Food Insecurity: No Food Insecurity (08/24/2022)   Hunger Vital Sign    Worried About Running Out of Food in the Last Year: Never true    Ran Out of Food in  the Last Year: Never true  Transportation Needs: No Transportation Needs (08/24/2022)   PRAPARE - Hydrologist (Medical): No    Lack of Transportation (Non-Medical): No  Physical Activity: Not on file  Stress: Not on file  Social Connections: Not on file     Family History: The patient's ***family history includes Breast cancer (age of onset: 13) in her mother; Cancer in her mother; Cirrhosis in her brother; Diabetes in her maternal grandmother and mother; Hypertension in her father and paternal aunt; Kidney disease in her father; Stroke in her maternal grandmother. There is no history of Anesthesia problems.  ROS:   Please see  the history of present illness.    *** All other systems reviewed and are negative.  EKGs/Labs/Other Studies Reviewed:    The following studies were reviewed today: ***  EKG:  EKG is *** ordered today.  The ekg ordered today demonstrates ***  Recent Labs: 08/30/2022: BUN 9; Creatinine, Ser 0.68; Hemoglobin 13.6; Platelets 280; Potassium 3.3; Sodium 140 11/20/2022: ALT 14; TSH 0.623  Recent Lipid Panel    Component Value Date/Time   CHOL 132 11/20/2022 0944   TRIG 80 11/20/2022 0944   HDL 42 11/20/2022 0944   CHOLHDL 3.1 11/20/2022 0944   LDLCALC 74 11/20/2022 0944     Risk Assessment/Calculations:   {Does this patient have ATRIAL FIBRILLATION?[phone]}  No BP recorded.  {Refresh Note OR Click here to enter BP  :1}***         Physical Exam:    VS:  LMP 05/25/2015     Wt Readings from Last 3 Encounters:  11/20/22 230 lb (104.3 kg)  08/30/22 233 lb (105.7 kg)  08/24/22 233 lb (105.7 kg)     GEN: *** Well nourished, well developed in no acute distress HEENT: Normal NECK: No JVD; No carotid bruits LYMPHATICS: No lymphadenopathy CARDIAC: ***RRR, no murmurs, rubs, gallops RESPIRATORY:  Clear to auscultation without rales, wheezing or rhonchi  ABDOMEN: Soft, non-tender, non-distended MUSCULOSKELETAL:  No edema; No deformity  SKIN: Warm and dry NEUROLOGIC:  Alert and oriented x 3 PSYCHIATRIC:  Normal affect   ASSESSMENT:    No diagnosis found. PLAN:    In order of problems listed above:  ***      {Are you ordering a CV Procedure (e.g. stress test, cath, DCCV, TEE, etc)?   Press F2        :[id number]    Medication Adjustments/Labs and Tests Ordered: Current medicines are reviewed at length with the patient today.  Concerns regarding medicines are outlined above.  No orders of the defined types were placed in this encounter.  No orders of the defined types were placed in this encounter.   There are no Patient Instructions on file for this visit.    Signed, Miyana Mordecai [place], MD  11/24/2022 9:08 AM    [place]

## 2022-11-27 ENCOUNTER — Encounter: Payer: Self-pay | Admitting: Cardiology

## 2022-11-27 ENCOUNTER — Ambulatory Visit: Payer: Commercial Managed Care - PPO | Attending: Cardiology | Admitting: Cardiology

## 2022-11-27 VITALS — BP 124/82 | HR 72 | Ht 65.0 in | Wt 233.2 lb

## 2022-11-27 DIAGNOSIS — R609 Edema, unspecified: Secondary | ICD-10-CM

## 2022-11-27 DIAGNOSIS — R0609 Other forms of dyspnea: Secondary | ICD-10-CM | POA: Diagnosis not present

## 2022-11-27 MED ORDER — FUROSEMIDE 20 MG PO TABS: 10.0000 mg | ORAL_TABLET | Freq: Every day | ORAL | 2 refills | Status: AC | PRN

## 2022-11-27 NOTE — Patient Instructions (Addendum)
Medication Instructions:   TAKE Lasix 10 mg as needed for swelling of lower extremities   *If you need a refill on your cardiac medications before your next appointment, please call your pharmacy*  Lab Work: NONE ordered at this time of appointment   If you have labs (blood work) drawn today and your tests are completely normal, you will receive your results only by: [place] (if you have MyChart) OR A paper copy in the mail If you have any lab test that is abnormal or we need to change your treatment, we will call you to review the results.  Testing/Procedures: Your physician has requested that you have an echocardiogram. Echocardiography is a painless test that uses sound waves to create images of your heart. It provides your doctor with information about the size and shape of your heart and how well your heart's chambers and valves are working. This procedure takes approximately one hour. There are no restrictions for this procedure. Please do NOT wear cologne, perfume, aftershave, or lotions (deodorant is allowed). Please arrive 15 minutes prior to your appointment time.  Follow-Up: At [facility], you and your health needs are our priority.  As part of our continuing mission to provide you with exceptional heart care, we have created designated Provider Care Teams.  These Care Teams include your primary Cardiologist (physician) and Advanced Practice Providers (APPs -  Physician Assistants and Nurse Practitioners) who all work together to provide you with the care you need, when you need it.   Your next appointment:   Pending results of Echocardiogram   Provider:   Peter Martinique, MD     Other Instructions      Low-Sodium Eating Plan  Sodium, which is an element that makes up salt, helps you maintain a healthy balance of fluids in your body. Too much sodium can increase your blood pressure and cause fluid and waste to be held in your body. Your health care  provider or dietitian may recommend following this plan if you have high blood pressure (hypertension), kidney disease, liver disease, or heart failure. Eating less sodium can help lower your blood pressure, reduce swelling, and protect your heart, liver, and kidneys. What are tips for following this plan? Reading food labels The Nutrition Facts label lists the amount of sodium in one serving of the food. If you eat more than one serving, you must multiply the listed amount of sodium by the number of servings. Choose foods with less than 140 mg of sodium per serving. Avoid foods with 300 mg of sodium or more per serving. Shopping  Look for lower-sodium products, often labeled as "low-sodium" or "no salt added." Always check the sodium content, even if foods are labeled as "unsalted" or "no salt added." Buy fresh foods. Avoid canned foods and pre-made or frozen meals. Avoid canned, cured, or processed meats. Buy breads that have less than 80 mg of sodium per slice. Cooking  Eat more home-cooked food and less restaurant, buffet, and fast food. Avoid adding salt when cooking. Use salt-free seasonings or herbs instead of table salt or sea salt. Check with your health care provider or pharmacist before using salt substitutes. Cook with plant-based oils, such as canola, sunflower, or olive oil. Meal planning When eating at a restaurant, ask that your food be prepared with less salt or no salt, if possible. Avoid dishes labeled as brined, pickled, cured, smoked, or made with soy sauce, miso, or teriyaki sauce. Avoid foods that contain MSG (monosodium  glutamate). MSG is sometimes added to [place] food, bouillon, and some canned foods. Make meals that can be grilled, baked, poached, roasted, or steamed. These are generally made with less sodium. General information Most people on this plan should limit their sodium intake to 1,500-2,000 mg (milligrams) of sodium each day. What foods should I  eat? Fruits Fresh, frozen, or canned fruit. Fruit juice. Vegetables Fresh or frozen vegetables. "No salt added" canned vegetables. "No salt added" tomato sauce and paste. Low-sodium or reduced-sodium tomato and vegetable juice. Grains Low-sodium cereals, including oats, puffed wheat and rice, and shredded wheat. Low-sodium crackers. Unsalted rice. Unsalted pasta. Low-sodium bread. Whole-grain breads and whole-grain pasta. Meats and other proteins Fresh or frozen (no salt added) meat, poultry, seafood, and fish. Low-sodium canned tuna and salmon. Unsalted nuts. Dried peas, beans, and lentils without added salt. Unsalted canned beans. Eggs. Unsalted nut butters. Dairy Milk. Soy milk. Cheese that is naturally low in sodium, such as ricotta cheese, fresh mozzarella, or Swiss cheese. Low-sodium or reduced-sodium cheese. Cream cheese. Yogurt. Seasonings and condiments Fresh and dried herbs and spices. Salt-free seasonings. Low-sodium mustard and ketchup. Sodium-free salad dressing. Sodium-free light mayonnaise. Fresh or refrigerated horseradish. Lemon juice. Vinegar. Other foods Homemade, reduced-sodium, or low-sodium soups. Unsalted popcorn and pretzels. Low-salt or salt-free chips. The items listed above may not be a complete list of foods and beverages you can eat. Contact a dietitian for more information. What foods should I avoid? Vegetables Sauerkraut, pickled vegetables, and relishes. Olives. [place] fries. Onion rings. Regular canned vegetables (not low-sodium or reduced-sodium). Regular canned tomato sauce and paste (not low-sodium or reduced-sodium). Regular tomato and vegetable juice (not low-sodium or reduced-sodium). Frozen vegetables in sauces. Grains Instant hot cereals. Bread stuffing, pancake, and biscuit mixes. Croutons. Seasoned rice or pasta mixes. Noodle soup cups. Boxed or frozen macaroni and cheese. Regular salted crackers. Self-rising flour. Meats and other proteins Meat or  fish that is salted, canned, smoked, spiced, or pickled. Precooked or cured meat, such as sausages or meat loaves. Berniece Salines. Ham. Pepperoni. Hot dogs. Corned beef. Chipped beef. Salt pork. Jerky. Pickled herring. Anchovies and sardines. Regular canned tuna. Salted nuts. Dairy Processed cheese and cheese spreads. Hard cheeses. Cheese curds. Blue cheese. Feta cheese. String cheese. Regular cottage cheese. Buttermilk. Canned milk. Fats and oils Salted butter. Regular margarine. Ghee. Bacon fat. Seasonings and condiments Onion salt, garlic salt, seasoned salt, table salt, and sea salt. Canned and packaged gravies. Worcestershire sauce. Tartar sauce. Barbecue sauce. Teriyaki sauce. Soy sauce, including reduced-sodium. Steak sauce. Fish sauce. Oyster sauce. Cocktail sauce. Horseradish that you find on the shelf. Regular ketchup and mustard. Meat flavorings and tenderizers. Bouillon cubes. Hot sauce. Pre-made or packaged marinades. Pre-made or packaged taco seasonings. Relishes. Regular salad dressings. Salsa. Other foods Salted popcorn and pretzels. Corn chips and puffs. Potato and tortilla chips. Canned or dried soups. Pizza. Frozen entrees and pot pies. The items listed above may not be a complete list of foods and beverages you should avoid. Contact a dietitian for more information. Summary Eating less sodium can help lower your blood pressure, reduce swelling, and protect your heart, liver, and kidneys. Most people on this plan should limit their sodium intake to 1,500-2,000 mg (milligrams) of sodium each day. Canned, boxed, and frozen foods are high in sodium. Restaurant foods, fast foods, and pizza are also very high in sodium. You also get sodium by adding salt to food. Try to cook at home, eat more fresh fruits and vegetables, and eat less fast  food and canned, processed, or prepared foods. This information is not intended to replace advice given to you by your health care provider. Make sure you  discuss any questions you have with your health care provider. Document Revised: 08/11/2019 Document Reviewed: 08/06/2019 Elsevier Patient Education  [place].

## 2022-11-27 NOTE — Addendum Note (Signed)
Addended by: Jacqulynn Cadet on: 11/27/2022 09:15 AM   Modules accepted: Orders

## 2022-12-25 ENCOUNTER — Ambulatory Visit (HOSPITAL_COMMUNITY): Payer: Commercial Managed Care - PPO | Attending: Cardiology

## 2022-12-25 DIAGNOSIS — R609 Edema, unspecified: Secondary | ICD-10-CM | POA: Insufficient documentation

## 2022-12-25 LAB — ECHOCARDIOGRAM COMPLETE
Area-P 1/2: 3.85 cm2
S' Lateral: 2.6 cm

## 2023-01-11 ENCOUNTER — Encounter: Payer: Self-pay | Admitting: Internal Medicine

## 2023-01-19 ENCOUNTER — Ambulatory Visit (AMBULATORY_SURGERY_CENTER): Payer: Commercial Managed Care - PPO | Admitting: *Deleted

## 2023-01-19 VITALS — Ht 65.0 in | Wt 215.0 lb

## 2023-01-19 DIAGNOSIS — Z1211 Encounter for screening for malignant neoplasm of colon: Secondary | ICD-10-CM

## 2023-01-19 MED ORDER — NA SULFATE-K SULFATE-MG SULF 17.5-3.13-1.6 GM/177ML PO SOLN: 1.0000 | Freq: Once | ORAL | 0 refills | Status: AC

## 2023-01-19 NOTE — Progress Notes (Signed)

## 2023-01-24 ENCOUNTER — Encounter: Payer: Self-pay | Admitting: Internal Medicine

## 2023-02-05 ENCOUNTER — Encounter: Payer: Self-pay | Admitting: Internal Medicine

## 2023-02-05 ENCOUNTER — Ambulatory Visit (AMBULATORY_SURGERY_CENTER): Payer: Commercial Managed Care - PPO | Admitting: Internal Medicine

## 2023-02-05 VITALS — BP 130/76 | HR 72 | Temp 98.6°F | Resp 16 | Ht 65.0 in | Wt 215.0 lb

## 2023-02-05 DIAGNOSIS — Z1211 Encounter for screening for malignant neoplasm of colon: Secondary | ICD-10-CM

## 2023-02-05 MED ORDER — SODIUM CHLORIDE 0.9 % IV SOLN: 500.0000 mL | Freq: Once | INTRAVENOUS | Status: DC

## 2023-02-05 NOTE — Progress Notes (Signed)
VS completed by CW   Pt's states no medical or surgical changes since previsit or office visit.  

## 2023-02-05 NOTE — Patient Instructions (Addendum)
No polyps or cancer seen.  You do have diverticulosis - thickened muscle rings and pouches in the colon wall. Please read the handout about this condition.  Next routine colonoscopy or other screening test in 10 years - 2034.  I appreciate the opportunity to care for you. Iva Boop, MD, [facility]                            - Resume previous diet.                           - Continue present medications.                           - Repeat colonoscopy in 10 years for screening                            purposes.   YOU HAD AN ENDOSCOPIC PROCEDURE TODAY AT THE [facility]:   Refer to the procedure report that was given to you for any specific questions about what was found during the examination.  If the procedure report does not answer your questions, please call your gastroenterologist to clarify.  If you requested that your care partner not be given the details of your procedure findings, then the procedure report has been included in a sealed envelope for you to review at your convenience later.  YOU SHOULD EXPECT: Some feelings of bloating in the abdomen. Passage of more gas than usual.  Walking can help get rid of the air that was put into your GI tract during the procedure and reduce the bloating. If you had a lower endoscopy (such as a colonoscopy or flexible sigmoidoscopy) you may notice spotting of blood in your stool or on the toilet paper. If you underwent a bowel prep for your procedure, you may not have a normal bowel movement for a few days.  Please Note:  You might notice some irritation and congestion in your nose or some drainage.  This is from the oxygen used during your procedure.  There is no need for concern and it should clear up in a day or so.  SYMPTOMS TO REPORT IMMEDIATELY:  Following lower endoscopy (colonoscopy or flexible sigmoidoscopy):  Excessive amounts of blood in the stool  Significant tenderness or worsening of abdominal pains  Swelling of the  abdomen that is new, acute  Fever of 100F or higher   For urgent or emergent issues, a gastroenterologist can be reached at any hour by calling [phone]. Do not use MyChart messaging for urgent concerns.    DIET:  We do recommend a small meal at first, but then you may proceed to your regular diet.  Drink plenty of fluids but you should avoid alcoholic beverages for 24 hours.  ACTIVITY:  You should plan to take it easy for the rest of today and you should NOT DRIVE or use heavy machinery until tomorrow (because of the sedation medicines used during the test).    FOLLOW UP: Our staff will call the number listed on your records the next business day following your procedure.  We will call around 7:15- 8:00 am to check on you and address any questions or concerns that you may have regarding the information given to you following your procedure. If we do not reach you, we  will leave a message.     If any biopsies were taken you will be contacted by phone or by letter within the next 1-3 weeks.  Please call us at [phone] if you have not heard about the biopsies in 3 weeks.    SIGNATURES/CONFIDENTIALITY: You and/or your care partner have signed paperwork which will be entered into your electronic medical record.  These signatures attest to the fact that that the information above on your After Visit Summary has been reviewed and is understood.  Full responsibility of the confidentiality of this discharge information lies with you and/or your care-partner.

## 2023-02-05 NOTE — Progress Notes (Signed)
[facility] History and Physical   Primary Care Physician:  Rema Fendt, NP   Reason for Procedure:   CRCA screening  Plan:    colonoscopy     HPI: Kristina Castro is a 47 y.o. 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    Past Surgical History:  Procedure Laterality Date   BILATERAL SALPINGECTOMY Bilateral 05/25/2015   Procedure: BILATERAL SALPINGECTOMY;  Surgeon: Mitchel Honour, DO;  Location: [facility];  Service: Gynecology;  Laterality: Bilateral;   CHOLECYSTECTOMY N/A 04/07/2021   Procedure: LAPAROSCOPIC CHOLECYSTECTOMY;  Surgeon: Griselda Miner, MD;  Location: [facility];  Service: General;  Laterality: N/A;   DILATION AND EVACUATION  x3   last one 2011   IUD REMOVAL Left 05/25/2015   Procedure: NEXPLANON REMOVAL LEFT UPPER ARM;  Surgeon: Mitchel Honour, DO;  Location: [facility];  Service: Gynecology;  Laterality: Left;   LAPAROSCOPIC ASSISTED VAGINAL HYSTERECTOMY N/A 05/25/2015   Procedure: LAPAROSCOPIC ASSISTED VAGINAL HYSTERECTOMY;  Surgeon: Mitchel Honour, DO;  Location: [facility];  Service: Gynecology;  Laterality: N/A;    Prior to Admission medications   Medication Sig Start Date End Date Taking? Authorizing Provider  acyclovir (ZOVIRAX) 800 MG tablet Take 800 mg by mouth daily.   Yes [provider]  oxybutynin (DITROPAN-XL) 10 MG 24 hr tablet Take 10 mg by mouth at bedtime.   Yes [provider]  furosemide (LASIX) 20  MG tablet Take 0.5 tablets (10 mg total) by mouth daily as needed for edema. Patient not taking: Reported on 02/05/2023 11/27/22   [place], Peter M, MD    Current Outpatient Medications  Medication Sig Dispense Refill   acyclovir (ZOVIRAX) 800 MG tablet Take 800 mg by mouth daily.     oxybutynin (DITROPAN-XL) 10 MG 24 hr tablet Take 10 mg by mouth at bedtime.     furosemide (LASIX) 20 MG tablet Take 0.5 tablets (10 mg total) by mouth daily as needed for edema. (Patient not taking: Reported on 02/05/2023) 30 tablet 2   Current Facility-Administered Medications  Medication Dose Route Frequency Provider Last Rate Last Admin   0.9 %  sodium chloride infusion  500 mL Intravenous Once Iva Boop, MD        Allergies as of 02/05/2023 - Review Complete 02/05/2023  Allergen Reaction Noted   Iodine-131 Nausea And Vomiting 12/18/2017   Adhesive [tape] Rash 05/19/2015   Gadolinium derivatives Nausea And Vomiting 12/09/2017   Wound dressing adhesive Rash 05/19/2015    Family History  Problem Relation Age of Onset   Breast cancer Mother 79   Cancer Mother        breast   Diabetes Mother    Arrhythmia Father    Hypertension Father    Kidney disease Father    Cirrhosis Brother    Hypertension Paternal Aunt    Stroke Maternal Grandmother    Diabetes Maternal Grandmother    Anesthesia  problems Neg Hx    Colon cancer Neg Hx    Colon polyps Neg Hx    Crohn's disease Neg Hx    Esophageal cancer Neg Hx    Rectal cancer Neg Hx    Stomach cancer Neg Hx    Ulcerative colitis Neg Hx     Social History   Socioeconomic History   Marital status: Married    Spouse name: Not on file   Number of children: 4   Years of education: Not on file   Highest education level: Associate degree: occupational, Scientist, product/process development, or vocational program  Occupational History   Not on file  Tobacco Use   Smoking status: Former    Types: Cigars    Passive exposure: Past   Smokeless tobacco: Never   Tobacco  comments:    hx smoking on and off--  currently cigar's 3 to 4 cigars per week  Vaping Use   Vaping Use: Never used  Substance and Sexual Activity   Alcohol use: Yes    Alcohol/week: 7.0 standard drinks of alcohol    Types: 7 Glasses of wine per week    Comment: daily wine   Drug use: No   Sexual activity: Yes    Birth control/protection: Surgical    Comment: 05/25/2015 - Hysterectomy  Other Topics Concern   Not on file  Social History Narrative   Nurse at [employer]    Social Determinants of Health   Financial Resource Strain: Not on file  Food Insecurity: No Food Insecurity (08/24/2022)   Hunger Vital Sign    Worried About Running Out of Food in the Last Year: Never true    Ran Out of Food in the Last Year: Never true  Transportation Needs: No Transportation Needs (08/24/2022)   PRAPARE - Administrator, Civil Service (Medical): No    Lack of Transportation (Non-Medical): No  Physical Activity: Not on file  Stress: Not on file  Social Connections: Not on file  Intimate Partner Violence: Not on file    Review of Systems:  All other review of systems negative except as mentioned in the HPI.  Physical Exam: Vital signs BP 126/77   Pulse 78   Temp 98.6 F (37 C) (Temporal)   Resp 13   Ht 5\' 5"  (1.651 m)   Wt 215 lb (97.5 kg)   LMP 05/25/2015   SpO2 100%   BMI 35.78 kg/m   General:   Alert,  Well-developed, well-nourished, pleasant and cooperative in NAD Lungs:  Clear throughout to auscultation.   Heart:  Regular rate and rhythm; no murmurs, clicks, rubs,  or gallops. Abdomen:  Soft, nontender and nondistended. Normal bowel sounds.   Neuro/Psych:  Alert and cooperative. Normal mood and affect. A and O x 3   @Shamar Engelmann  Sena Slate, MD, [facility] [phone] (pager) 02/05/2023 8:33 AM@

## 2023-02-05 NOTE — Op Note (Signed)
[facility] Patient Name: Kristina Castro Procedure Date: 02/05/2023 8:26 AM MRN: [id number] Endoscopist: Iva Boop , MD, [id number] Age: 47 Referring MD:  Date of Birth: 07/16/1976 Gender: Female Account #: [id number] Procedure:                Colonoscopy Indications:              Screening for colorectal malignant neoplasm, This                            is the patient's first colonoscopy Medicines:                Monitored Anesthesia Care Procedure:                Pre-Anesthesia Assessment:                           - Prior to the procedure, a History and Physical                            was performed, and patient medications and                            allergies were reviewed. The patient's tolerance of                            previous anesthesia was also reviewed. The risks                            and benefits of the procedure and the sedation                            options and risks were discussed with the patient.                            All questions were answered, and informed consent                            was obtained. Prior Anticoagulants: The patient has                            taken no anticoagulant or antiplatelet agents. ASA                            Grade Assessment: II - A patient with mild systemic                            disease. After reviewing the risks and benefits,                            the patient was deemed in satisfactory condition to                            undergo the procedure.  After obtaining informed consent, the colonoscope                            was passed under direct vision. Throughout the                            procedure, the patient's blood pressure, pulse, and                            oxygen saturations were monitored continuously. The                            Olympus CF-HQ190L [id number]) Colonoscope was                            introduced through the  anus and advanced to the the                            cecum, identified by appendiceal orifice and                            ileocecal valve. The colonoscopy was performed                            without difficulty. The patient tolerated the                            procedure well. The quality of the bowel                            preparation was good. The ileocecal valve,                            appendiceal orifice, and rectum were photographed.                            The bowel preparation used was SUPREP via split                            dose instruction. Scope In: 8:43:33 AM Scope Out: 8:54:09 AM Scope Withdrawal Time: 0 hours 8 minutes 56 seconds  Total Procedure Duration: 0 hours 10 minutes 36 seconds  Findings:                 The perianal and digital rectal examinations were                            normal.                           Multiple diverticula were found in the entire colon.                           The exam was otherwise without abnormality on  direct and retroflexion views. Complications:            No immediate complications. Estimated Blood Loss:     Estimated blood loss was minimal. Impression:               - Diverticulosis in the entire examined colon.                           - The examination was otherwise normal on direct                            and retroflexion views.                           - No specimens collected. Recommendation:           - Patient has a contact number available for                            emergencies. The signs and symptoms of potential                            delayed complications were discussed with the                            patient. Return to normal activities tomorrow.                            Written discharge instructions were provided to the                            patient.                           - Resume previous diet.                           - Continue  present medications.                           - Repeat colonoscopy in 10 years for screening                            purposes. Iva Boop, MD 02/05/2023 9:01:03 AM This report has been signed electronically.

## 2023-02-05 NOTE — Progress Notes (Signed)
Uneventful anesthetic. Report to pacu rn. Vss. Care resumed by rn. 

## 2023-02-06 ENCOUNTER — Telehealth: Payer: Self-pay

## 2023-02-06 NOTE — Telephone Encounter (Signed)
  Follow up Call-     02/05/2023    7:42 AM  Call back number  Post procedure Call Back phone  # [phone]  Permission to leave phone message Yes     Patient questions:  Do you have a fever, pain , or abdominal swelling? No. Pain Score  0 *  Have you tolerated food without any problems? Yes.    Have you been able to return to your normal activities? Yes.    Do you have any questions about your discharge instructions: Diet   No. Medications  No. Follow up visit  No.  Do you have questions or concerns about your Care? No.  Actions: * If pain score is 4 or above: No action needed, pain <4.
